# Patient Record
Sex: Female | Born: 1950 | Race: Black or African American | Hispanic: No | Smoking: Former smoker
Health system: Southern US, Community
[De-identification: ages and names within clinical notes are randomized; demographics above are authoritative.]

## PROBLEM LIST (undated history)

## (undated) DIAGNOSIS — K2270 Barrett's esophagus without dysplasia: Secondary | ICD-10-CM

## (undated) DIAGNOSIS — M1710 Unilateral primary osteoarthritis, unspecified knee: Secondary | ICD-10-CM

## (undated) DIAGNOSIS — M549 Dorsalgia, unspecified: Secondary | ICD-10-CM

## (undated) DIAGNOSIS — D259 Leiomyoma of uterus, unspecified: Secondary | ICD-10-CM

## (undated) DIAGNOSIS — K5792 Diverticulitis of intestine, part unspecified, without perforation or abscess without bleeding: Secondary | ICD-10-CM

## (undated) DIAGNOSIS — J449 Chronic obstructive pulmonary disease, unspecified: Secondary | ICD-10-CM

## (undated) DIAGNOSIS — I82409 Acute embolism and thrombosis of unspecified deep veins of unspecified lower extremity: Secondary | ICD-10-CM

## (undated) DIAGNOSIS — T7840XA Allergy, unspecified, initial encounter: Secondary | ICD-10-CM

## (undated) DIAGNOSIS — K449 Diaphragmatic hernia without obstruction or gangrene: Secondary | ICD-10-CM

## (undated) DIAGNOSIS — F32A Depression, unspecified: Secondary | ICD-10-CM

## (undated) DIAGNOSIS — Z87442 Personal history of urinary calculi: Secondary | ICD-10-CM

## (undated) DIAGNOSIS — O3410 Maternal care for benign tumor of corpus uteri, unspecified trimester: Secondary | ICD-10-CM

## (undated) DIAGNOSIS — M179 Osteoarthritis of knee, unspecified: Secondary | ICD-10-CM

## (undated) DIAGNOSIS — G5600 Carpal tunnel syndrome, unspecified upper limb: Secondary | ICD-10-CM

## (undated) DIAGNOSIS — F419 Anxiety disorder, unspecified: Secondary | ICD-10-CM

## (undated) HISTORY — DX: Diaphragmatic hernia without obstruction or gangrene: K44.9

## (undated) HISTORY — DX: Osteoarthritis of knee, unspecified: M17.9

## (undated) HISTORY — DX: Leiomyoma of uterus, unspecified: D25.9

## (undated) HISTORY — DX: Acute embolism and thrombosis of unspecified deep veins of unspecified lower extremity: I82.409

## (undated) HISTORY — DX: Unilateral primary osteoarthritis, unspecified knee: M17.10

## (undated) HISTORY — DX: Allergy, unspecified, initial encounter: T78.40XA

## (undated) HISTORY — DX: Barrett's esophagus without dysplasia: K22.70

## (undated) HISTORY — DX: Personal history of urinary calculi: Z87.442

## (undated) HISTORY — PX: TOTAL KNEE ARTHROPLASTY: SHX125

## (undated) HISTORY — DX: Carpal tunnel syndrome, unspecified upper limb: G56.00

## (undated) HISTORY — PX: CARPAL TUNNEL RELEASE: SHX101

## (undated) HISTORY — DX: Dorsalgia, unspecified: M54.9

## (undated) HISTORY — PX: COLON SURGERY: SHX602

## (undated) HISTORY — DX: Chronic obstructive pulmonary disease, unspecified: J44.9

## (undated) HISTORY — PX: LAPAROSCOPY: SHX197

## (undated) HISTORY — DX: Leiomyoma of uterus, unspecified: O34.10

## (undated) HISTORY — DX: Anxiety disorder, unspecified: F41.9

## (undated) HISTORY — DX: Depression, unspecified: F32.A

## (undated) HISTORY — DX: Diverticulitis of intestine, part unspecified, without perforation or abscess without bleeding: K57.92

---

## 1998-05-30 ENCOUNTER — Ambulatory Visit (HOSPITAL_COMMUNITY): Admission: RE | Admit: 1998-05-30 | Discharge: 1998-05-30 | Payer: Self-pay | Admitting: Obstetrics & Gynecology

## 1998-11-13 ENCOUNTER — Encounter: Admission: RE | Admit: 1998-11-13 | Discharge: 1999-02-11 | Payer: Self-pay | Admitting: Internal Medicine

## 1999-03-19 ENCOUNTER — Other Ambulatory Visit: Admission: RE | Admit: 1999-03-19 | Discharge: 1999-03-19 | Payer: Self-pay | Admitting: Obstetrics & Gynecology

## 1999-05-11 ENCOUNTER — Ambulatory Visit (HOSPITAL_COMMUNITY): Admission: RE | Admit: 1999-05-11 | Discharge: 1999-05-11 | Payer: Self-pay | Admitting: *Deleted

## 1999-05-11 ENCOUNTER — Encounter: Payer: Self-pay | Admitting: *Deleted

## 2000-06-06 ENCOUNTER — Ambulatory Visit (HOSPITAL_COMMUNITY): Admission: RE | Admit: 2000-06-06 | Discharge: 2000-06-06 | Payer: Self-pay | Admitting: Obstetrics & Gynecology

## 2000-06-06 ENCOUNTER — Encounter: Payer: Self-pay | Admitting: Obstetrics & Gynecology

## 2000-06-23 ENCOUNTER — Encounter: Admission: RE | Admit: 2000-06-23 | Discharge: 2000-09-21 | Payer: Self-pay | Admitting: Internal Medicine

## 2001-10-22 ENCOUNTER — Ambulatory Visit (HOSPITAL_COMMUNITY): Admission: RE | Admit: 2001-10-22 | Discharge: 2001-10-22 | Payer: Self-pay | Admitting: Obstetrics and Gynecology

## 2001-10-22 ENCOUNTER — Encounter: Payer: Self-pay | Admitting: Obstetrics and Gynecology

## 2002-10-14 ENCOUNTER — Other Ambulatory Visit: Admission: RE | Admit: 2002-10-14 | Discharge: 2002-10-14 | Payer: Self-pay | Admitting: Obstetrics and Gynecology

## 2003-11-15 ENCOUNTER — Other Ambulatory Visit: Admission: RE | Admit: 2003-11-15 | Discharge: 2003-11-15 | Payer: Self-pay | Admitting: Obstetrics and Gynecology

## 2003-11-18 ENCOUNTER — Encounter: Admission: RE | Admit: 2003-11-18 | Discharge: 2003-11-18 | Payer: Self-pay | Admitting: Obstetrics and Gynecology

## 2004-09-24 ENCOUNTER — Ambulatory Visit: Payer: Self-pay | Admitting: Internal Medicine

## 2004-10-01 ENCOUNTER — Ambulatory Visit: Payer: Self-pay | Admitting: Internal Medicine

## 2004-11-23 ENCOUNTER — Other Ambulatory Visit: Admission: RE | Admit: 2004-11-23 | Discharge: 2004-11-23 | Payer: Self-pay | Admitting: Internal Medicine

## 2004-11-23 ENCOUNTER — Ambulatory Visit: Payer: Self-pay | Admitting: Internal Medicine

## 2004-12-18 ENCOUNTER — Encounter: Admission: RE | Admit: 2004-12-18 | Discharge: 2004-12-18 | Payer: Self-pay | Admitting: Internal Medicine

## 2005-07-19 ENCOUNTER — Ambulatory Visit: Payer: Self-pay | Admitting: Internal Medicine

## 2005-07-23 ENCOUNTER — Encounter: Admission: RE | Admit: 2005-07-23 | Discharge: 2005-07-23 | Payer: Self-pay | Admitting: Internal Medicine

## 2005-08-27 ENCOUNTER — Ambulatory Visit: Payer: Self-pay | Admitting: Internal Medicine

## 2005-09-05 ENCOUNTER — Ambulatory Visit: Payer: Self-pay | Admitting: Gastroenterology

## 2005-09-06 ENCOUNTER — Encounter (INDEPENDENT_AMBULATORY_CARE_PROVIDER_SITE_OTHER): Payer: Self-pay | Admitting: Specialist

## 2005-09-06 ENCOUNTER — Ambulatory Visit: Payer: Self-pay | Admitting: Gastroenterology

## 2005-09-11 ENCOUNTER — Ambulatory Visit: Payer: Self-pay | Admitting: Gastroenterology

## 2005-10-01 ENCOUNTER — Ambulatory Visit: Payer: Self-pay | Admitting: Gastroenterology

## 2005-10-15 ENCOUNTER — Ambulatory Visit: Payer: Self-pay | Admitting: Internal Medicine

## 2005-10-18 ENCOUNTER — Encounter: Admission: RE | Admit: 2005-10-18 | Discharge: 2005-10-18 | Payer: Self-pay | Admitting: Internal Medicine

## 2006-02-16 ENCOUNTER — Encounter: Admission: RE | Admit: 2006-02-16 | Discharge: 2006-02-16 | Payer: Self-pay | Admitting: Internal Medicine

## 2006-05-02 ENCOUNTER — Encounter: Payer: Self-pay | Admitting: Internal Medicine

## 2006-09-18 ENCOUNTER — Encounter: Admission: RE | Admit: 2006-09-18 | Discharge: 2006-09-18 | Payer: Self-pay | Admitting: Internal Medicine

## 2007-02-23 ENCOUNTER — Ambulatory Visit: Payer: Self-pay | Admitting: Internal Medicine

## 2007-02-24 LAB — CONVERTED CEMR LAB
ALT: 21 units/L (ref 0–40)
Albumin: 4 g/dL (ref 3.5–5.2)
Alkaline Phosphatase: 80 units/L (ref 39–117)
BUN: 19 mg/dL (ref 6–23)
Basophils Absolute: 0 10*3/uL (ref 0.0–0.1)
Basophils Relative: 0.7 % (ref 0.0–1.0)
Calcium: 9.2 mg/dL (ref 8.4–10.5)
Eosinophils Absolute: 0.3 10*3/uL (ref 0.0–0.6)
GFR calc Af Amer: 83 mL/min
GFR calc non Af Amer: 69 mL/min
Lymphocytes Relative: 32.1 % (ref 12.0–46.0)
MCHC: 33.6 g/dL (ref 30.0–36.0)
MCV: 83 fL (ref 78.0–100.0)
Monocytes Relative: 6.4 % (ref 3.0–11.0)
Neutro Abs: 3.7 10*3/uL (ref 1.4–7.7)
Platelets: 236 10*3/uL (ref 150–400)

## 2007-03-02 ENCOUNTER — Encounter: Payer: Self-pay | Admitting: Internal Medicine

## 2008-01-28 ENCOUNTER — Ambulatory Visit: Payer: Self-pay | Admitting: Internal Medicine

## 2008-01-28 DIAGNOSIS — M5416 Radiculopathy, lumbar region: Secondary | ICD-10-CM

## 2008-01-28 DIAGNOSIS — E1149 Type 2 diabetes mellitus with other diabetic neurological complication: Secondary | ICD-10-CM | POA: Insufficient documentation

## 2008-01-28 DIAGNOSIS — M48061 Spinal stenosis, lumbar region without neurogenic claudication: Secondary | ICD-10-CM | POA: Insufficient documentation

## 2008-01-28 DIAGNOSIS — R609 Edema, unspecified: Secondary | ICD-10-CM | POA: Insufficient documentation

## 2008-01-28 LAB — CONVERTED CEMR LAB
ALT: 12 units/L (ref 0–35)
Amylase: 146 units/L — ABNORMAL HIGH (ref 27–131)
Basophils Absolute: 0 10*3/uL (ref 0.0–0.1)
Bilirubin, Direct: 0.1 mg/dL (ref 0.0–0.3)
CO2: 30 meq/L (ref 19–32)
Calcium: 9.8 mg/dL (ref 8.4–10.5)
Cholesterol: 196 mg/dL (ref 0–200)
Creatinine, Ser: 1 mg/dL (ref 0.4–1.2)
Creatinine,U: 101.1 mg/dL
Eosinophils Absolute: 0.3 10*3/uL (ref 0.0–0.6)
GFR calc Af Amer: 73 mL/min
GFR calc non Af Amer: 61 mL/min
Glucose, Urine, Semiquant: NEGATIVE
Hemoglobin: 12.8 g/dL (ref 12.0–15.0)
Hgb A1c MFr Bld: 6.1 % — ABNORMAL HIGH (ref 4.6–6.0)
Ketones, urine, test strip: NEGATIVE
LDL Cholesterol: 131 mg/dL — ABNORMAL HIGH (ref 0–99)
Lipase: 36 units/L (ref 11.0–59.0)
Lymphocytes Relative: 25 % (ref 12.0–46.0)
MCHC: 31.7 g/dL (ref 30.0–36.0)
MCV: 84.8 fL (ref 78.0–100.0)
Microalb Creat Ratio: 2 mg/g (ref 0.0–30.0)
Microalb, Ur: 0.2 mg/dL (ref 0.0–1.9)
Neutro Abs: 2.5 10*3/uL (ref 1.4–7.7)
Neutrophils Relative %: 57.3 % (ref 43.0–77.0)
Protein, U semiquant: NEGATIVE
RDW: 14.9 % — ABNORMAL HIGH (ref 11.5–14.6)
Sodium: 146 meq/L — ABNORMAL HIGH (ref 135–145)
Total Bilirubin: 0.8 mg/dL (ref 0.3–1.2)
Triglycerides: 37 mg/dL (ref 0–149)
Urobilinogen, UA: 0.2
VLDL: 7 mg/dL (ref 0–40)
pH: 5.5

## 2008-02-01 DIAGNOSIS — K219 Gastro-esophageal reflux disease without esophagitis: Secondary | ICD-10-CM | POA: Insufficient documentation

## 2008-02-01 DIAGNOSIS — Z87442 Personal history of urinary calculi: Secondary | ICD-10-CM | POA: Insufficient documentation

## 2008-02-05 ENCOUNTER — Ambulatory Visit: Payer: Self-pay | Admitting: Internal Medicine

## 2008-02-15 ENCOUNTER — Encounter: Admission: RE | Admit: 2008-02-15 | Discharge: 2008-02-15 | Payer: Self-pay | Admitting: Internal Medicine

## 2008-03-29 ENCOUNTER — Ambulatory Visit: Payer: Self-pay | Admitting: Internal Medicine

## 2008-10-03 ENCOUNTER — Encounter (INDEPENDENT_AMBULATORY_CARE_PROVIDER_SITE_OTHER): Payer: Self-pay | Admitting: *Deleted

## 2008-10-04 ENCOUNTER — Encounter: Payer: Self-pay | Admitting: Internal Medicine

## 2008-11-04 LAB — HM DEXA SCAN: HM DEXA SCAN: NORMAL

## 2009-02-02 ENCOUNTER — Telehealth: Payer: Self-pay | Admitting: Internal Medicine

## 2009-03-31 ENCOUNTER — Ambulatory Visit: Payer: Self-pay | Admitting: Internal Medicine

## 2009-03-31 LAB — CONVERTED CEMR LAB
Bilirubin Urine: NEGATIVE
Blood in Urine, dipstick: NEGATIVE
Glucose, Urine, Semiquant: NEGATIVE
Ketones, urine, test strip: NEGATIVE
Protein, U semiquant: NEGATIVE
Urobilinogen, UA: 0.2
pH: 6

## 2009-04-04 LAB — CONVERTED CEMR LAB
AST: 25 units/L (ref 0–37)
Albumin: 4 g/dL (ref 3.5–5.2)
Alkaline Phosphatase: 74 units/L (ref 39–117)
BUN: 25 mg/dL — ABNORMAL HIGH (ref 6–23)
Bilirubin, Direct: 0.1 mg/dL (ref 0.0–0.3)
CO2: 30 meq/L (ref 19–32)
Chloride: 109 meq/L (ref 96–112)
Cholesterol: 202 mg/dL — ABNORMAL HIGH (ref 0–200)
Creatinine, Ser: 1 mg/dL (ref 0.4–1.2)
Direct LDL: 138 mg/dL
Eosinophils Absolute: 0.2 10*3/uL (ref 0.0–0.7)
Glucose, Bld: 109 mg/dL — ABNORMAL HIGH (ref 70–99)
Hgb A1c MFr Bld: 6.3 % (ref 4.6–6.5)
Lipase: 28 units/L (ref 11.0–59.0)
Lymphocytes Relative: 42.3 % (ref 12.0–46.0)
MCHC: 33.5 g/dL (ref 30.0–36.0)
MCV: 84.6 fL (ref 78.0–100.0)
Microalb Creat Ratio: 1.9 mg/g (ref 0.0–30.0)
Microalb, Ur: 0.2 mg/dL (ref 0.0–1.9)
Monocytes Absolute: 0.5 10*3/uL (ref 0.1–1.0)
Neutrophils Relative %: 40.8 % — ABNORMAL LOW (ref 43.0–77.0)
Platelets: 181 10*3/uL (ref 150.0–400.0)
TSH: 0.59 microintl units/mL (ref 0.35–5.50)
Total Protein: 7.1 g/dL (ref 6.0–8.3)
VLDL: 6.6 mg/dL (ref 0.0–40.0)
WBC: 4.2 10*3/uL — ABNORMAL LOW (ref 4.5–10.5)

## 2009-04-28 ENCOUNTER — Encounter: Payer: Self-pay | Admitting: Internal Medicine

## 2009-06-19 ENCOUNTER — Encounter: Payer: Self-pay | Admitting: Internal Medicine

## 2009-06-19 ENCOUNTER — Telehealth: Payer: Self-pay | Admitting: Internal Medicine

## 2009-07-20 ENCOUNTER — Encounter: Admission: RE | Admit: 2009-07-20 | Discharge: 2009-07-20 | Payer: Self-pay | Admitting: Internal Medicine

## 2009-07-21 DIAGNOSIS — R928 Other abnormal and inconclusive findings on diagnostic imaging of breast: Secondary | ICD-10-CM | POA: Insufficient documentation

## 2009-07-25 ENCOUNTER — Encounter: Payer: Self-pay | Admitting: Internal Medicine

## 2009-07-27 ENCOUNTER — Encounter: Admission: RE | Admit: 2009-07-27 | Discharge: 2009-07-27 | Payer: Self-pay | Admitting: Internal Medicine

## 2009-07-28 ENCOUNTER — Encounter: Payer: Self-pay | Admitting: Internal Medicine

## 2009-09-15 ENCOUNTER — Encounter: Payer: Self-pay | Admitting: Internal Medicine

## 2009-10-06 ENCOUNTER — Ambulatory Visit: Payer: Self-pay | Admitting: Internal Medicine

## 2009-10-10 ENCOUNTER — Encounter: Payer: Self-pay | Admitting: Internal Medicine

## 2009-10-10 LAB — CONVERTED CEMR LAB
ALT: 15 units/L (ref 0–35)
AST: 20 units/L (ref 0–37)
BUN: 13 mg/dL (ref 6–23)
Basophils Absolute: 0 10*3/uL (ref 0.0–0.1)
Chloride: 103 meq/L (ref 96–112)
Creatinine,U: 108.2 mg/dL
Eosinophils Relative: 7.4 % — ABNORMAL HIGH (ref 0.0–5.0)
GFR calc non Af Amer: 82.4 mL/min (ref >60)
HDL: 62.9 mg/dL (ref >39.00)
Hemoglobin: 12.9 g/dL (ref 12.0–15.0)
Lymphocytes Relative: 40.5 % (ref 12.0–46.0)
Microalb Creat Ratio: 0.9 mg/g (ref 0.0–30.0)
Monocytes Relative: 9.8 % (ref 3.0–12.0)
Neutro Abs: 2 10*3/uL (ref 1.4–7.7)
Phosphorus: 3.7 mg/dL (ref 2.3–4.6)
Potassium: 4 meq/L (ref 3.5–5.1)
RBC: 4.64 M/uL (ref 3.87–5.11)
RDW: 14 % (ref 11.5–14.6)
Sodium: 142 meq/L (ref 135–145)
Total CHOL/HDL Ratio: 3
Triglycerides: 40 mg/dL (ref 0.0–149.0)
WBC: 4.8 10*3/uL (ref 4.5–10.5)

## 2009-10-11 ENCOUNTER — Ambulatory Visit: Payer: Self-pay

## 2009-10-11 ENCOUNTER — Encounter: Payer: Self-pay | Admitting: Internal Medicine

## 2009-10-12 ENCOUNTER — Ambulatory Visit: Payer: Self-pay | Admitting: Cardiovascular Disease

## 2009-10-26 ENCOUNTER — Telehealth: Payer: Self-pay | Admitting: Internal Medicine

## 2009-10-31 ENCOUNTER — Encounter: Payer: Self-pay | Admitting: Internal Medicine

## 2009-11-14 ENCOUNTER — Encounter: Payer: Self-pay | Admitting: Internal Medicine

## 2010-01-25 ENCOUNTER — Encounter: Admission: RE | Admit: 2010-01-25 | Discharge: 2010-01-25 | Payer: Self-pay | Admitting: Internal Medicine

## 2010-01-25 LAB — HM MAMMOGRAPHY: HM Mammogram: NORMAL

## 2010-03-17 IMAGING — CT CT ABDOMEN W/ CM
2 of 6 series · 17 of 46 positions shown, 19 images · IV contrast (agent unspecified)
Comparison: 10/18/2005

CLINICAL DATA: Abdominal pain, elevated amylase

CT ABDOMEN WITH CONTRAST
TECHNIQUE: Multidetector CT imaging of the abdomen was performed
using the standard protocol following bolus administration of
intravenous contrast.
Contrast: 100 ml Ymnipaque-Y11 IV

[Series 2: abd_ xxl 5.0 · axial · 0.84mm/px · z∈[-285,-35]mm · 14 of 58 slices shown, 16 images]
[im 4/58  soft-tissue]
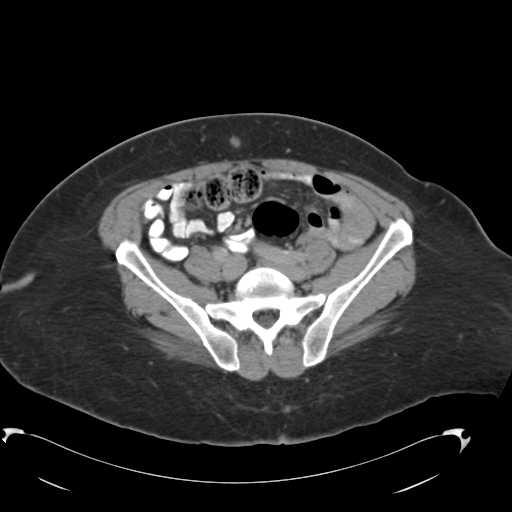
[im 4/58  bone]
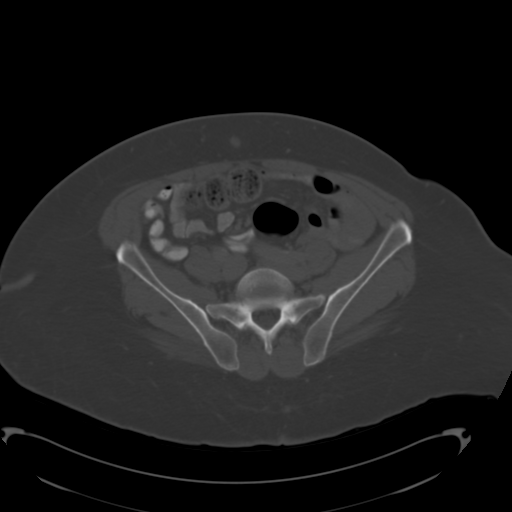
[im 8/58  soft-tissue]
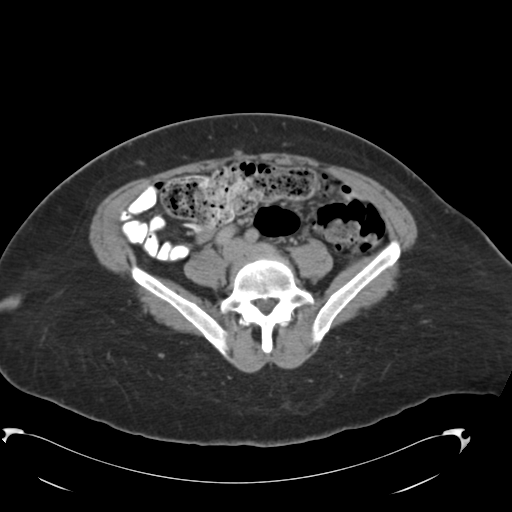
[im 12/58  soft-tissue]
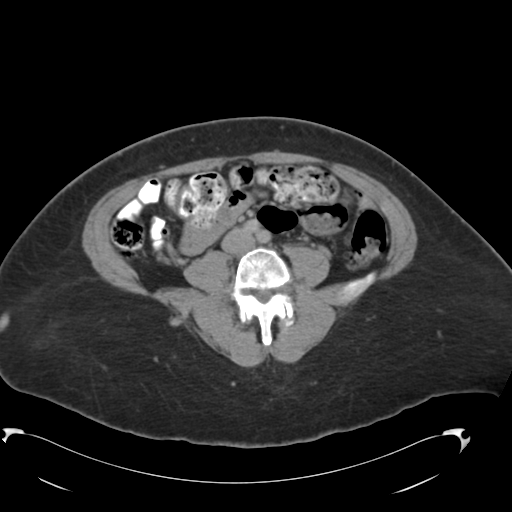
[im 16/58  soft-tissue]
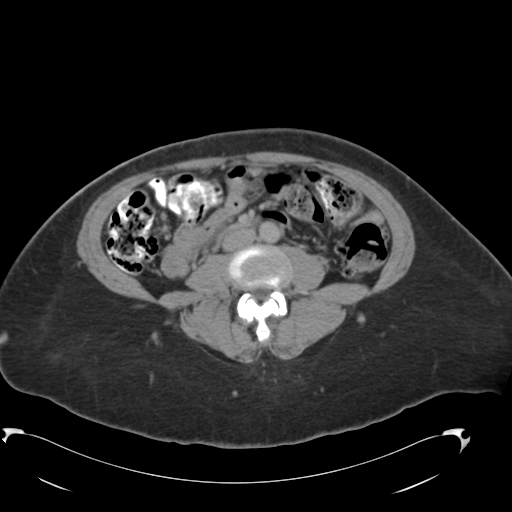
[im 20/58  soft-tissue]
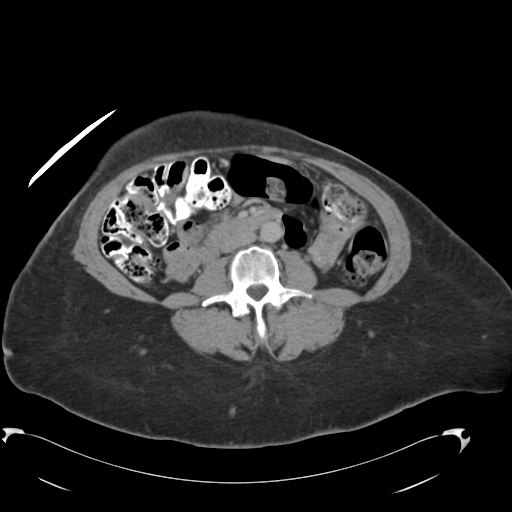
[im 23/58  soft-tissue]
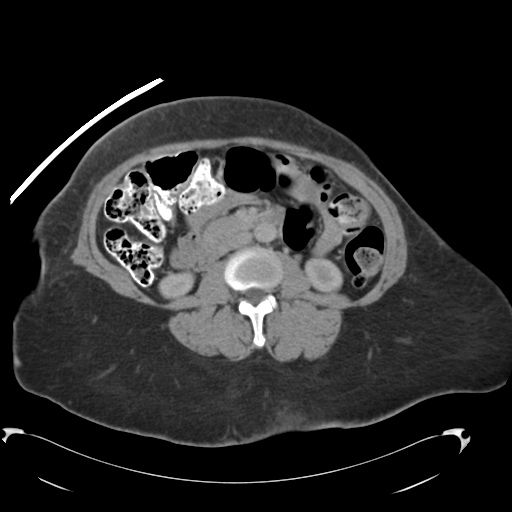
[im 27/58  soft-tissue]
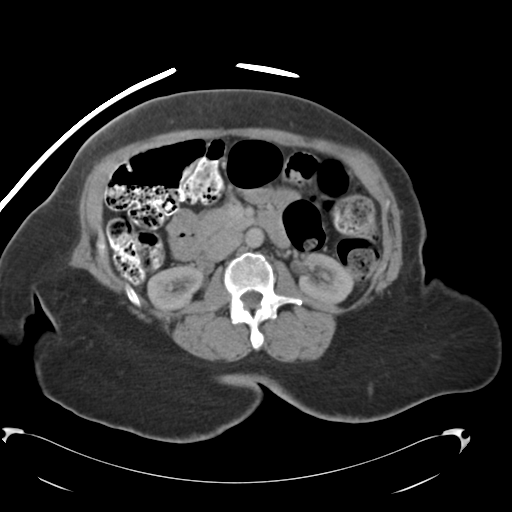
[im 31/58  soft-tissue]
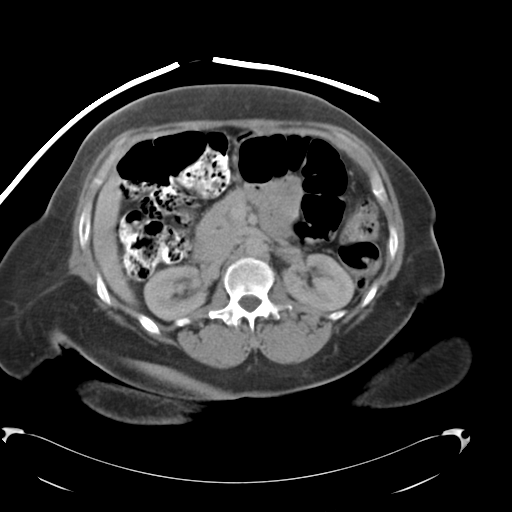
[im 35/58  soft-tissue]
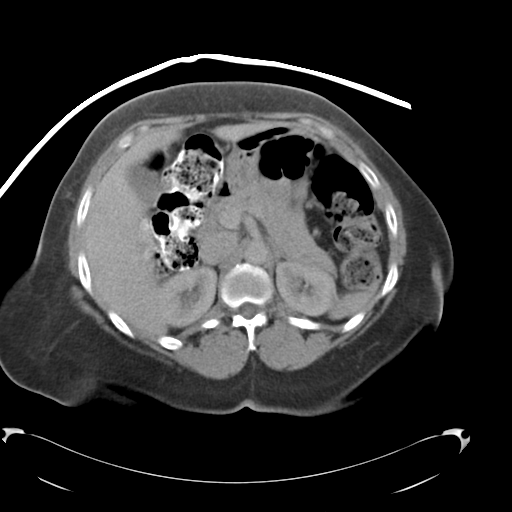
[im 35/58  bone]
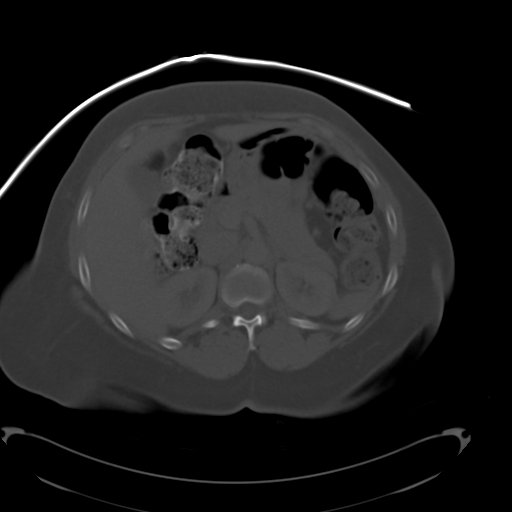
[im 39/58  soft-tissue]
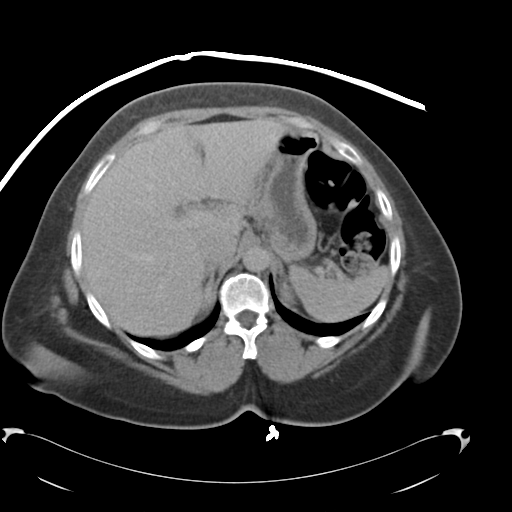
[im 42/58  soft-tissue]
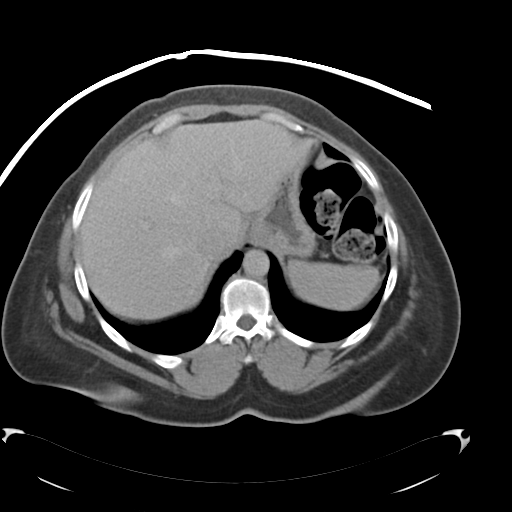
[im 46/58  soft-tissue]
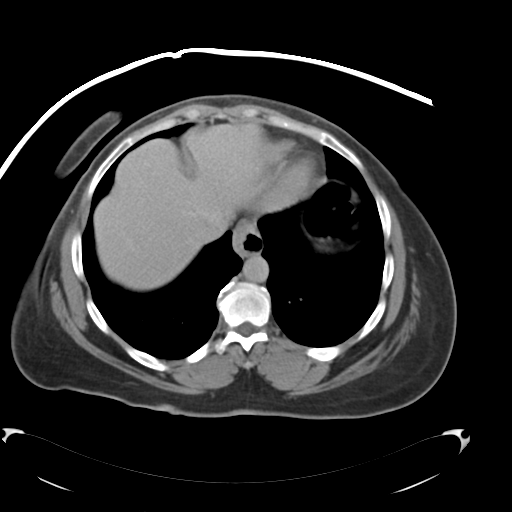
[im 50/58  soft-tissue]
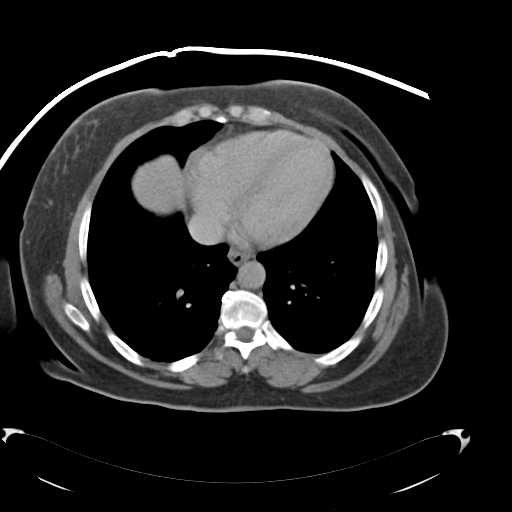
[im 54/58  soft-tissue]
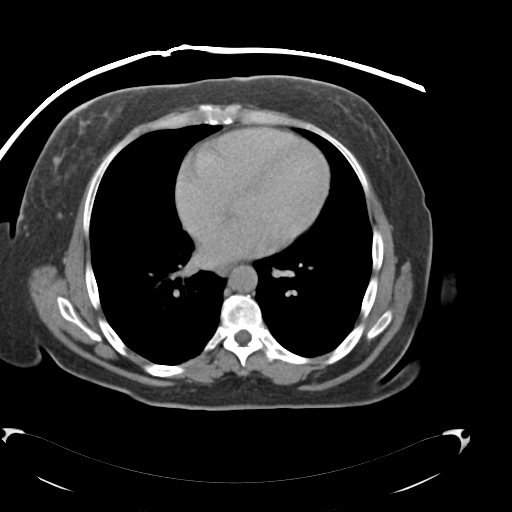

[Series 603: <mpr range> · coronal · 0.84mm/px · 3 of 132 slices shown]
[im 44/132  soft-tissue]
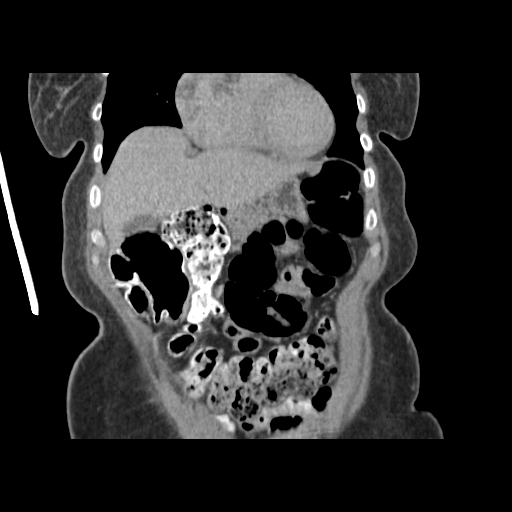
[im 59/132  soft-tissue]
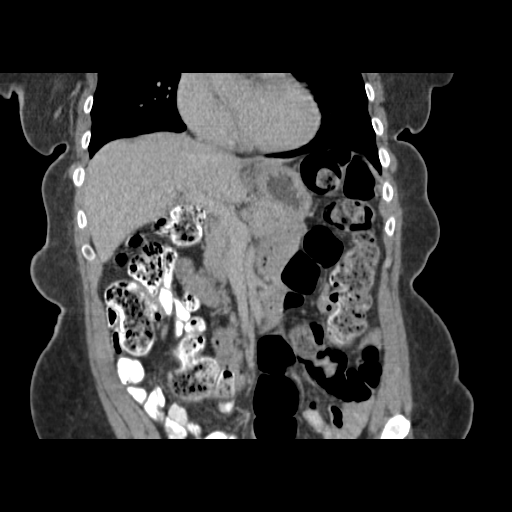
[im 73/132  soft-tissue]
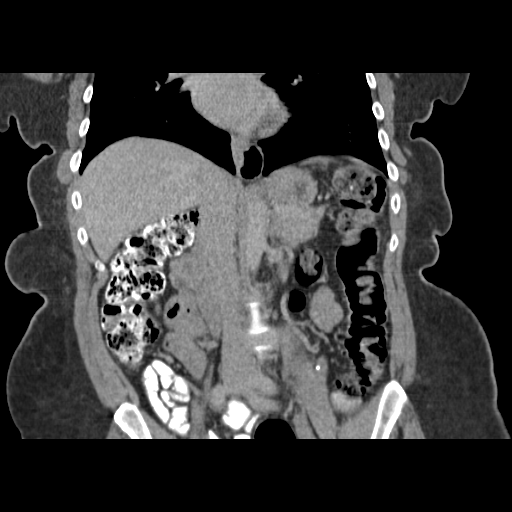

[17 of 46 positions shown; findings below may reference images not displayed]

FINDINGS: Unremarkable liver, nondilated gallbladder, spleen,
adrenal glands, kidneys, pancreas, abdominal aorta.  Visualized
portions of small bowel and colon are decompressed.  No free air.
No ascites.  Portal vein patent.  No mesenteric or retroperitoneal
adenopathy.  Delayed scans show no hydronephrosis.  Facet disease
L4-5 likely accounts for the grade 1 anterolisthesis at this level,
which is increased since previous MR 02/16/2006.
IMPRESSION: 1.  No acute abdominal process.
2.  Progressive facet degenerative changes L4-5 with grade 1
anterolisthesis.

## 2010-07-12 ENCOUNTER — Ambulatory Visit: Payer: Self-pay | Admitting: Internal Medicine

## 2010-07-12 ENCOUNTER — Encounter (INDEPENDENT_AMBULATORY_CARE_PROVIDER_SITE_OTHER): Payer: Self-pay | Admitting: *Deleted

## 2010-07-16 LAB — CONVERTED CEMR LAB
Albumin: 4.2 g/dL (ref 3.5–5.2)
Alkaline Phosphatase: 83 units/L (ref 39–117)
Amylase: 193 units/L — ABNORMAL HIGH (ref 27–131)
BUN: 19 mg/dL (ref 6–23)
Basophils Absolute: 0.1 10*3/uL (ref 0.0–0.1)
Bilirubin, Direct: 0.1 mg/dL (ref 0.0–0.3)
Eosinophils Absolute: 0.1 10*3/uL (ref 0.0–0.7)
Glucose, Bld: 97 mg/dL (ref 70–99)
Lipase: 33 units/L (ref 11.0–59.0)
Lymphocytes Relative: 40.8 % (ref 12.0–46.0)
MCHC: 33.2 g/dL (ref 30.0–36.0)
Neutrophils Relative %: 45.7 % (ref 43.0–77.0)
Phosphorus: 3.5 mg/dL (ref 2.3–4.6)
Platelets: 177 10*3/uL (ref 150.0–400.0)
Potassium: 4.5 meq/L (ref 3.5–5.1)
RDW: 14.8 % — ABNORMAL HIGH (ref 11.5–14.6)

## 2010-11-24 ENCOUNTER — Encounter: Payer: Self-pay | Admitting: Internal Medicine

## 2010-12-04 NOTE — Letter (Signed)
Summary: [facility]  [facility]   Imported By: Lanelle Bal 11/20/2009 08:10:13  _____________________________________________________________________  External Attachment:    Type:   Image     Comment:   External Document

## 2010-12-04 NOTE — Letter (Signed)
Summary: New Patient letter  [facility]  [address] [place], [place] [address]   Phone: [phone]  Fax: [phone]       07/12/2010 MRN: [id number]  Heather Shaffer [address] [place], [place]  [address]  Dear Ms. Harbuck,  Welcome to the Gastroenterology Division at [facility].    You are scheduled to see Dr.  Melvia Heaps on August 23, 2010 at 10:45am on the 3rd floor at [facility], [address]  We ask that you try to arrive at our office 15 minutes prior to your appointment time to allow for check-in.  We would like you to complete the enclosed self-administered evaluation form prior to your visit and bring it with you on the day of your appointment.  We will review it with you.  Also, please bring a complete list of all your medications or, if you prefer, bring the medication bottles and we will list them.  Please bring your insurance card so that we may make a copy of it.  If your insurance requires a referral to see a specialist, please bring your referral form from your primary care physician.  Co-payments are due at the time of your visit and may be paid by cash, check or credit card.     Your office visit will consist of a consult with your physician (includes a physical exam), any laboratory testing he/she may order, scheduling of any necessary diagnostic testing (e.g. x-ray, ultrasound, CT-scan), and scheduling of a procedure (e.g. Endoscopy, Colonoscopy) if required.  Please allow enough time on your schedule to allow for any/all of these possibilities.    If you cannot keep your appointment, please call [phone] to cancel or reschedule prior to your appointment date.  This allows [place] the opportunity to schedule an appointment for another patient in need of care.  If you do not cancel or reschedule by 5 p.m. the business day prior to your appointment date, you will be charged a $50.00 late cancellation/no-show fee.     Thank you for choosing [facility] for your medical needs.  We appreciate the opportunity to care for you.  Please visit [place] at our website  to learn more about our practice.                     Sincerely,                                                             The Gastroenterology Division

## 2010-12-04 NOTE — Miscellaneous (Signed)
   Clinical Lists Changes  Observations: Added new observation of DIAB EYE EX: No diabetic retinopathy.    (11/10/2009 11:04)      Diabetic Eye Exam  Procedure date:  11/10/2009  Findings:      No diabetic retinopathy.

## 2010-12-04 NOTE — Assessment & Plan Note (Signed)
Summary: 6 MTH FU/CLE   Vital Signs:  Patient profile:   60 year old female Weight:      228 pounds BMI:     39.90 Temp:     98.2 degrees F oral Pulse rate:   64 / minute Pulse rhythm:   regular BP sitting:   122 / 78  (left arm) Cuff size:   large  Vitals Entered By: Mervin Hack CMA Duncan Dull) (July 12, 2010 2:39 PM) CC: 6 month follow-up   History of Present Illness: Doing okay generally  Still has sense of something sitting right over xiphoid No problems swallowing but gets sense down low occ chokes in throat--feels like something lodged there  Has had a lot of inactivity been sitting in hospital and home with sister dying of pancreatic cancer having a hard time with this  Checking sugars several times per week at various times Fasting AM are often over 130 though not over 150 Non fasting are generally in the 150's  Still having pain in back and neck generally gets by with one hydrocodone daily  No chest pan No SOB   Allergies: 1)  ! Bactrim Ds (Sulfamethoxazole-Trimethoprim)  Past History:  Past medical, surgical, family and social histories (including risk factors) reviewed for relevance to current acute and chronic problems.  Past Medical History: Back injections at [facility] for back pain &  spinal stenosis Diabetes mellitus, type II DVT, hx of 1998 post knee replacement Barretts esophagus 09/2005 Dr.  Arlyce Dice Carpal tunnel syndrome Nephrolithiasis, hx of Hyperlipidemia Uterine fibroids Diverticulosis 2006 Hemorrhoids,internal 2006  Past Surgical History: Reviewed history from 03/29/2008 and no changes required. Right knee replacement Laparoscopy childbirth  Family History: Reviewed history from 10/06/2009 and no changes required. Family History Diabetes 1st degree relative Family History Hypertension CAD Sister with pancreas cancer  Social History: Reviewed history from 03/29/2008 and no changes required. 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  NAD Neck:  supple, no masses, no thyromegaly, no carotid bruits, and no cervical lymphadenopathy.  Mild pain with full rotation both ways Lungs:  normal respiratory effort, no intercostal retractions, no accessory muscle use, and normal breath sounds.   Heart:  normal rate, regular rhythm, no murmur, and no gallop.   Abdomen:  soft and non-tender.   Pulses:  1+ in each foot Extremities:  trace edema Skin:  no suspicious lesions and no ulcerations.   Psych:  normally interactive, good eye contact, not anxious appearing, and dysphoric affect.    Diabetes Management Exam:    Foot Exam (with socks and/or shoes not present):       Sensory-Pinprick/Light touch:          Left medial foot (L-4): diminished          Left dorsal foot (L-5): diminished          Left lateral foot (S-1): diminished          Right medial foot (L-4): diminished          Right dorsal foot (L-5): diminished          Right lateral foot (S-1): diminished       Inspection:  Left foot: normal          Right foot: abnormal             Comments: pes planus       Nails:          Left foot: thickened          Right foot: thickened   Impression & Recommendations:  Problem # 1:  DIABETES MELLITUS, TYPE II (ICD-250.00) Assessment Deteriorated  control worsened will start metformin if >8% she will work on lifestyle issues  Orders: TLB-A1C / Hgb A1C (Glycohemoglobin) (83036-A1C)  Problem # 2:  ABDOMINAL PAIN (ICD-789.00) Assessment: Unchanged  persistent seemingly esophageal discomfort will have her check back in with Dr Arlyce Dice  Orders: TLB-Amylase  (82150-AMYL) TLB-Lipase (83690-LIPASE) TLB-Renal Function Panel (80069-RENAL) TLB-CBC Platelet - w/Differential (85025-CBCD) TLB-Hepatic/Liver Function Pnl (80076-HEPATIC) TLB-TSH (Thyroid Stimulating Hormone) (84443-TSH) Venipuncture (09811) Gastroenterology Referral (GI)  Problem # 3:  DEGENERATIVE JOINT DISEASE, 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 1/2- 1 three  times a day as needed severe pain  Patient Instructions: 1)  Please schedule a follow-up appointment in 6 months .  2)  Referral Appointment Information 3)  Day/Date: 4)  Time: 5)  Place/MD: 6)  Address: 7)  Phone/Fax: 8)  Patient given appointment information. Information/Orders faxed/mailed. Prescriptions: VICODIN 5-500 MG  TABS (HYDROCODONE-ACETAMINOPHEN) 1/2- 1 three  times a day as needed severe pain  #90 x 1   Entered and Authorized by:   Cindee Salt MD   Signed by:   Cindee Salt MD on 07/12/2010   Method used:   Print then Give to Patient   RxID:   [id number]   Current Allergies (reviewed today): ! BACTRIM DS (SULFAMETHOXAZOLE-TRIMETHOPRIM)

## 2010-12-14 ENCOUNTER — Encounter: Payer: Self-pay | Admitting: Internal Medicine

## 2010-12-14 ENCOUNTER — Other Ambulatory Visit: Payer: Self-pay | Admitting: Internal Medicine

## 2010-12-14 ENCOUNTER — Ambulatory Visit (INDEPENDENT_AMBULATORY_CARE_PROVIDER_SITE_OTHER): Payer: 59 | Admitting: Internal Medicine

## 2010-12-14 DIAGNOSIS — E119 Type 2 diabetes mellitus without complications: Secondary | ICD-10-CM

## 2010-12-14 DIAGNOSIS — M479 Spondylosis, unspecified: Secondary | ICD-10-CM

## 2010-12-14 DIAGNOSIS — E785 Hyperlipidemia, unspecified: Secondary | ICD-10-CM

## 2010-12-20 NOTE — Assessment & Plan Note (Signed)
Summary: 6 m f/u dlo   Vital Signs:  Patient profile:   60 year old female Weight:      239 pounds Temp:     98.6 degrees F oral Pulse rate:   60 / minute Pulse rhythm:   regular BP sitting:   113 / 76  (left arm) Cuff size:   large  Vitals Entered By: Mervin Hack CMA Duncan Dull) (December 14, 2010 11:02 AM) CC: follow-up   History of Present Illness: Doing okay  checks sugars occ generally only a little over 100 Due for eye exam  No sores on feet Ongoing back pain and down right side uses hydrocodone  ~4 times per week No other meds  Trying to get back to eating right prefers no meds for this  No swallowing problems Occ feelings of fullness No heartburn---occ gets burp  Allergies: 1)  ! Bactrim Ds (Sulfamethoxazole-Trimethoprim)  Past History:  Past medical, surgical, family and social histories (including risk factors) reviewed for relevance to current acute and chronic problems.  Past Medical History: Reviewed history from 07/12/2010 and no changes required. Back injections at [facility] for back pain &  spinal stenosis Diabetes mellitus, type II DVT, hx of 1998 post knee replacement Barretts esophagus 09/2005 Dr.  Arlyce Dice Carpal tunnel syndrome Nephrolithiasis, hx of Hyperlipidemia Uterine fibroids Diverticulosis 2006 Hemorrhoids,internal 2006  Past Surgical History: Reviewed history from 03/29/2008 and no changes required. Right knee replacement Laparoscopy childbirth  Family History: Family History Diabetes 1st degree relative Family History Hypertension CAD Sister died of  pancreas cancer  Social History: Reviewed history from 03/29/2008 and no changes required. 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 Uses wood stove--discussed humidifier  Physical Exam  General:  alert and normal appearance.   Neck:  supple, no masses, and no cervical lymphadenopathy.   Lungs:  normal respiratory effort, no intercostal retractions, no accessory muscle use, and normal breath sounds.   Heart:  normal rate, regular rhythm, no murmur, and no gallop.   Abdomen:  soft and non-tender.   Msk:  Very limited movement up to table or reclining due to back Pulses:  1+ in feet Extremities:  no edema Skin:  no suspicious lesions and no ulcerations.   Psych:  normally interactive, good eye contact, not anxious appearing, and not depressed appearing.    Diabetes Management Exam:    Foot Exam (with socks and/or shoes not present):       Sensory-Pinprick/Light touch:          Left medial foot (L-4): diminished          Left dorsal foot (L-5): diminished          Left lateral foot (S-1): diminished          Right medial foot (L-4): diminished          Right dorsal foot (L-5): diminished          Right lateral foot (S-1): diminished       Inspection:          Left foot: normal          Right foot: normal       Nails:  Left foot: normal          Right foot: normal   Impression & Recommendations:  Problem # 1:  DIABETES MELLITUS, TYPE II (ICD-250.00) Assessment Unchanged  seems to have good control without meds will work more on lifestyle since her sister's death and loss of responsibility and time with her  Labs Reviewed: Creat: 0.9 (07/12/2010)     Last Eye Exam: No diabetic retinopathy.    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 1/2- 1 three  times a day as needed severe pain  Patient Instructions: 1)  Please schedule a follow-up appointment in 6 months .    Orders Added: 1)  Est. Patient Level IV [[address]] 2)  TLB-A1C / Hgb A1C (Glycohemoglobin) [83036-A1C]    Current Allergies (reviewed today): ! BACTRIM DS (SULFAMETHOXAZOLE-TRIMETHOPRIM)

## 2011-06-14 ENCOUNTER — Encounter: Payer: Self-pay | Admitting: Internal Medicine

## 2011-06-17 ENCOUNTER — Encounter: Payer: Self-pay | Admitting: Internal Medicine

## 2011-06-17 ENCOUNTER — Ambulatory Visit (INDEPENDENT_AMBULATORY_CARE_PROVIDER_SITE_OTHER): Payer: 59 | Admitting: Internal Medicine

## 2011-06-17 VITALS — BP 130/78 | HR 66 | Temp 98.6°F | Ht 63.0 in | Wt 241.0 lb

## 2011-06-17 DIAGNOSIS — F439 Reaction to severe stress, unspecified: Secondary | ICD-10-CM

## 2011-06-17 DIAGNOSIS — E119 Type 2 diabetes mellitus without complications: Secondary | ICD-10-CM

## 2011-06-17 DIAGNOSIS — Z2911 Encounter for prophylactic immunotherapy for respiratory syncytial virus (RSV): Secondary | ICD-10-CM

## 2011-06-17 DIAGNOSIS — E785 Hyperlipidemia, unspecified: Secondary | ICD-10-CM

## 2011-06-17 DIAGNOSIS — Z0000 Encounter for general adult medical examination without abnormal findings: Secondary | ICD-10-CM

## 2011-06-17 DIAGNOSIS — Z0001 Encounter for general adult medical examination with abnormal findings: Secondary | ICD-10-CM | POA: Insufficient documentation

## 2011-06-17 DIAGNOSIS — M479 Spondylosis, unspecified: Secondary | ICD-10-CM

## 2011-06-17 DIAGNOSIS — Z733 Stress, not elsewhere classified: Secondary | ICD-10-CM

## 2011-06-17 LAB — BASIC METABOLIC PANEL
BUN: 18 mg/dL (ref 6–23)
Calcium: 9.3 mg/dL (ref 8.4–10.5)
Chloride: 99 mEq/L (ref 96–112)
Creatinine, Ser: 1.1 mg/dL (ref 0.4–1.2)

## 2011-06-17 LAB — HEPATIC FUNCTION PANEL
Albumin: 4.3 g/dL (ref 3.5–5.2)
Alkaline Phosphatase: 95 U/L (ref 39–117)
Total Protein: 8.2 g/dL (ref 6.0–8.3)

## 2011-06-17 LAB — CBC WITH DIFFERENTIAL/PLATELET
Eosinophils Absolute: 0.5 10*3/uL (ref 0.0–0.7)
Eosinophils Relative: 8.5 % — ABNORMAL HIGH (ref 0.0–5.0)
MCHC: 32.6 g/dL (ref 30.0–36.0)
MCV: 85.1 fl (ref 78.0–100.0)
Monocytes Absolute: 0.4 10*3/uL (ref 0.1–1.0)
Neutrophils Relative %: 53 % (ref 43.0–77.0)
Platelets: 209 10*3/uL (ref 150.0–400.0)
WBC: 5.4 10*3/uL (ref 4.5–10.5)

## 2011-06-17 LAB — LIPID PANEL
HDL: 76.1 mg/dL (ref >39.00)
Total CHOL/HDL Ratio: 3
VLDL: 6.4 mg/dL (ref 0.0–40.0)

## 2011-06-17 LAB — HEMOGLOBIN A1C: Hgb A1c MFr Bld: 6.7 % — ABNORMAL HIGH (ref 4.6–6.5)

## 2011-06-17 LAB — LDL CHOLESTEROL, DIRECT: Direct LDL: 126.9 mg/dL

## 2011-06-17 NOTE — Assessment & Plan Note (Signed)
Lab Results  Component Value Date   LDLCALC 126* 10/06/2009   Will recheck  Discussed meds She adamantly doesn't want meds for this
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
## 2011-06-17 NOTE — Assessment & Plan Note (Signed)
Still seems to have good control Will recheck A1c 

## 2011-06-17 NOTE — Assessment & Plan Note (Signed)
Ongoing pain but currently is seems her pain might be more in her knee Has ortho appt coming up

## 2011-06-17 NOTE — Progress Notes (Signed)
Subjective:    Patient ID: Heather Shaffer, female    DOB: 1951/01/03, 60 y.o.   MRN: [id number]  HPI Having "excrutiating pain" in right leg Hard to walk Did have arthroscopy on right knee in 1998--then attributable to spinal stenosis Can't lie down flat with knee---has to keep bent Does have appt set up with orthopedic surgeon at [facility] Has gotten 1 blister---worried about shingles  Sees gyn at [facility] Last pap 1-2 years ago Gets breast exam and mammogram at [facility] this year  Checks sugars once a week or so 100-120 Due for eye exam---she will schedule  No current outpatient prescriptions on file prior to visit.    Allergies  Allergen Reactions  . Sulfamethoxazole W/Trimethoprim     REACTION: tongue swells    Past Medical History  Diagnosis Date  . Diabetes mellitus   . DVT (deep venous thrombosis)     hx of 1998 post knee replacement  . Back pain     Back injections at [facility] for back pain &  spinal stenosis  . Barrett's esophagus   . Carpal tunnel syndrome   . History of nephrolithiasis   . Hyperlipidemia   . Uterine fibroids affecting pregnancy   . Diverticulitis   . Hemorrhoids     Past Surgical History  Procedure Date  . Total knee arthroplasty     right  . Laparoscopy     Family History  Problem Relation Age of Onset  . Cancer Sister     History   Social History  . Marital Status: Married    Spouse Name: N/A    Number of Children: 3  . Years of Education: N/A   Occupational History  . Realtor, Emergency planning/management officer    Social History Main Topics  . Smoking status: Former Games developer  . Smokeless tobacco: Never Used  . Alcohol Use: No  . Drug Use: No  . Sexually Active: Not on file   Other Topics Concern  . Not on file   Social History Narrative  . No narrative on file   Review of Systems  Constitutional:       Has gained a few more pounds---can't exercise Wears seat belt  HENT: Positive for dental problem. Negative for  hearing loss, congestion, rhinorrhea and tinnitus.        Recently lost bridge and crown  Eyes: Negative for visual disturbance.       No diplopia or unilateral vision loss  Respiratory: Negative for cough, chest tightness and shortness of breath.   Cardiovascular: Positive for leg swelling. Negative for chest pain and palpitations.       Some swelling in right leg Had negative doppler for clot at urgent care  Gastrointestinal: Positive for constipation. Negative for nausea, vomiting, abdominal pain and blood in stool.       Needs stool softeners with the tramadol and hydrocodone  Genitourinary: Positive for frequency and difficulty urinating.       No incontinence Sig nocturia No recent sex  Musculoskeletal: Positive for back pain, joint swelling and arthralgias.  Skin: Negative for rash.       Has the isolated blister  Neurological: Positive for weakness and headaches. Negative for dizziness, syncope and numbness.  Hematological: Negative for adenopathy. Does not bruise/bleed easily.  Psychiatric/Behavioral: Positive for sleep disturbance and dysphoric mood. The patient is nervous/anxious.        Having trouble sleeping due to leg pain Some degree of depression---chronic pain, "marriage falling apart" (has been gradual) Plans  to separate from him Does get enjoyment from grandchildren and church Was on antidepressants ~20 years ago       Objective:   Physical Exam  Constitutional: She appears well-developed and well-nourished. No distress.  HENT:  Head: Normocephalic and atraumatic.  Right Ear: External ear normal.  Left Ear: External ear normal.  Mouth/Throat: Oropharynx is clear and moist. No oropharyngeal exudate.       TMs normal  Eyes: Conjunctivae and EOM are normal. Pupils are equal, round, and reactive to light.       Fundi benign  Neck: Normal range of motion. Neck supple. No thyromegaly present.  Cardiovascular: Normal rate, regular rhythm, normal heart sounds and  intact distal pulses.  Exam reveals no gallop.   No murmur heard. Pulmonary/Chest: Effort normal and breath sounds normal. No respiratory distress. She has no wheezes. She has no rales.  Abdominal: Soft. There is no tenderness.  Musculoskeletal: She exhibits no edema.       Deformity and effusion of right knee  Lymphadenopathy:    She has no cervical adenopathy.  Skin: No rash noted. No erythema.       Single blister along lateral right thigh  Psychiatric: Her behavior is normal. Judgment and thought content normal.       Tearful but appropriate          Assessment & Plan:

## 2011-06-17 NOTE — Assessment & Plan Note (Signed)
UTD on colon Has gyn visit coming up Will give zostavax but she doesn't want tetanus---very pain ful in the past

## 2011-06-18 LAB — MICROALBUMIN / CREATININE URINE RATIO: Creatinine,U: 103.6 mg/dL

## 2011-07-22 ENCOUNTER — Encounter: Payer: Self-pay | Admitting: Internal Medicine

## 2011-07-24 ENCOUNTER — Encounter: Payer: Self-pay | Admitting: Internal Medicine

## 2011-07-24 ENCOUNTER — Ambulatory Visit (INDEPENDENT_AMBULATORY_CARE_PROVIDER_SITE_OTHER): Payer: 59 | Admitting: Internal Medicine

## 2011-07-24 DIAGNOSIS — M479 Spondylosis, unspecified: Secondary | ICD-10-CM

## 2011-07-24 DIAGNOSIS — Z733 Stress, not elsewhere classified: Secondary | ICD-10-CM

## 2011-07-24 DIAGNOSIS — F439 Reaction to severe stress, unspecified: Secondary | ICD-10-CM

## 2011-07-24 MED ORDER — HYDROCODONE-ACETAMINOPHEN 5-500 MG PO TABS: 0.5000 | ORAL_TABLET | Freq: Three times a day (TID) | ORAL | Status: DC | PRN

## 2011-07-24 MED ORDER — TRAMADOL HCL 50 MG PO TABS: 50.0000 mg | ORAL_TABLET | Freq: Three times a day (TID) | ORAL | Status: DC | PRN

## 2011-07-24 NOTE — Assessment & Plan Note (Signed)
Ongoing pain  Tramadol was not as strong but tolerated better during the day Will refill this and have the hydrocodone for really bad times
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
## 2011-07-24 NOTE — Assessment & Plan Note (Signed)
Ongoing issues but is dealing with it No major depression No meds needed Feels she is doing okay without counselling

## 2011-07-30 ENCOUNTER — Other Ambulatory Visit: Payer: Self-pay | Admitting: *Deleted

## 2011-07-30 NOTE — Telephone Encounter (Signed)
Pt is asking for a new script for free style lite test strips and lancets.  She has never had a script before, has always used her husbands.  Uses [facility] on [address].

## 2011-07-31 NOTE — Telephone Encounter (Signed)
Okay to send Rx for a year Sig should be test daily or as directed

## 2011-08-01 MED ORDER — FREESTYLE LANCETS MISC: 1.0000 | Freq: Every day | Status: DC | PRN

## 2011-08-01 MED ORDER — GLUCOSE BLOOD VI STRP: 1.0000 | ORAL_STRIP | Freq: Every day | Status: DC | PRN

## 2011-08-01 NOTE — Telephone Encounter (Signed)
rx sent to pharmacy by e-script Spoke with patient and advised results   

## 2011-08-28 ENCOUNTER — Encounter: Payer: Self-pay | Admitting: Internal Medicine

## 2011-10-10 ENCOUNTER — Other Ambulatory Visit: Payer: Self-pay | Admitting: Internal Medicine

## 2011-10-10 DIAGNOSIS — Z1231 Encounter for screening mammogram for malignant neoplasm of breast: Secondary | ICD-10-CM

## 2011-10-31 ENCOUNTER — Ambulatory Visit
Admission: RE | Admit: 2011-10-31 | Discharge: 2011-10-31 | Disposition: A | Discharge status: Home / Self Care | Payer: 59 | Source: Ambulatory Visit | Attending: Internal Medicine | Admitting: Internal Medicine

## 2011-10-31 DIAGNOSIS — Z1231 Encounter for screening mammogram for malignant neoplasm of breast: Secondary | ICD-10-CM

## 2011-11-04 ENCOUNTER — Encounter: Payer: Self-pay | Admitting: *Deleted

## 2012-01-22 ENCOUNTER — Ambulatory Visit (INDEPENDENT_AMBULATORY_CARE_PROVIDER_SITE_OTHER): Payer: 59 | Admitting: Internal Medicine

## 2012-01-22 ENCOUNTER — Encounter: Payer: Self-pay | Admitting: Internal Medicine

## 2012-01-22 VITALS — BP 126/80 | HR 69 | Temp 97.8°F | Ht 63.0 in | Wt 231.0 lb

## 2012-01-22 DIAGNOSIS — IMO0002 Reserved for concepts with insufficient information to code with codable children: Secondary | ICD-10-CM

## 2012-01-22 DIAGNOSIS — E119 Type 2 diabetes mellitus without complications: Secondary | ICD-10-CM

## 2012-01-22 DIAGNOSIS — Z733 Stress, not elsewhere classified: Secondary | ICD-10-CM

## 2012-01-22 DIAGNOSIS — F439 Reaction to severe stress, unspecified: Secondary | ICD-10-CM

## 2012-01-22 DIAGNOSIS — M1710 Unilateral primary osteoarthritis, unspecified knee: Secondary | ICD-10-CM | POA: Insufficient documentation

## 2012-01-22 DIAGNOSIS — M179 Osteoarthritis of knee, unspecified: Secondary | ICD-10-CM

## 2012-01-22 DIAGNOSIS — E785 Hyperlipidemia, unspecified: Secondary | ICD-10-CM

## 2012-01-22 LAB — HEMOGLOBIN A1C: Hgb A1c MFr Bld: 6.7 % — ABNORMAL HIGH (ref 4.6–6.5)

## 2012-01-22 MED ORDER — DICLOFENAC SODIUM 1 % TD GEL: 1.0000 "application " | Freq: Four times a day (QID) | TRANSDERMAL | Status: DC

## 2012-01-22 NOTE — Assessment & Plan Note (Signed)
Still seems to have good control Will check a1c

## 2012-01-22 NOTE — Assessment & Plan Note (Signed)
Tramadol helps Bad at night though Will try voltaren gel

## 2012-01-22 NOTE — Assessment & Plan Note (Signed)
Ongoing marital stress Trying to decide the best way to manage this
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
## 2012-01-22 NOTE — Assessment & Plan Note (Signed)
She will try red yeast rice

## 2012-01-27 ENCOUNTER — Encounter: Payer: Self-pay | Admitting: *Deleted

## 2012-05-15 ENCOUNTER — Other Ambulatory Visit: Payer: Self-pay | Admitting: *Deleted

## 2012-05-15 MED ORDER — TRAMADOL HCL 50 MG PO TABS: 50.0000 mg | ORAL_TABLET | Freq: Three times a day (TID) | ORAL | Status: DC | PRN

## 2012-05-15 NOTE — Telephone Encounter (Signed)
rx sent to pharmacy by e-script  

## 2012-05-15 NOTE — Telephone Encounter (Signed)
Okay #90 x 0 

## 2012-07-31 ENCOUNTER — Encounter: Payer: 59 | Admitting: Internal Medicine

## 2012-09-17 ENCOUNTER — Other Ambulatory Visit: Payer: Self-pay

## 2012-09-17 MED ORDER — HYDROCODONE-ACETAMINOPHEN 5-500 MG PO TABS: 0.5000 | ORAL_TABLET | Freq: Three times a day (TID) | ORAL | Status: DC | PRN

## 2012-09-17 MED ORDER — TRAMADOL HCL 50 MG PO TABS: 50.0000 mg | ORAL_TABLET | Freq: Three times a day (TID) | ORAL | Status: DC | PRN

## 2012-09-17 NOTE — Telephone Encounter (Signed)
Okay #60 x 0 of hydrocodone  #90 x 0 of tramadol

## 2012-09-17 NOTE — Telephone Encounter (Signed)
rx called into pharmacy

## 2012-09-17 NOTE — Telephone Encounter (Signed)
pt left v/m pt has CPX 10/09/12. Request refill tramadol and hydrocodone to [facility].Please advise.

## 2012-09-30 ENCOUNTER — Other Ambulatory Visit: Payer: Self-pay | Admitting: Internal Medicine

## 2012-09-30 DIAGNOSIS — Z1231 Encounter for screening mammogram for malignant neoplasm of breast: Secondary | ICD-10-CM

## 2012-10-06 ENCOUNTER — Encounter: Payer: Self-pay | Admitting: Obstetrics and Gynecology

## 2012-10-06 ENCOUNTER — Ambulatory Visit (INDEPENDENT_AMBULATORY_CARE_PROVIDER_SITE_OTHER): Payer: 59 | Admitting: Obstetrics and Gynecology

## 2012-10-06 VITALS — BP 120/72 | Ht 63.6 in | Wt 228.0 lb

## 2012-10-06 DIAGNOSIS — Z01419 Encounter for gynecological examination (general) (routine) without abnormal findings: Secondary | ICD-10-CM

## 2012-10-06 DIAGNOSIS — Z780 Asymptomatic menopausal state: Secondary | ICD-10-CM
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
## 2012-10-09 ENCOUNTER — Ambulatory Visit (INDEPENDENT_AMBULATORY_CARE_PROVIDER_SITE_OTHER): Payer: 59 | Admitting: Internal Medicine

## 2012-10-09 ENCOUNTER — Encounter: Payer: Self-pay | Admitting: Internal Medicine

## 2012-10-09 VITALS — BP 130/80 | HR 71 | Temp 98.8°F | Ht 63.0 in | Wt 229.0 lb

## 2012-10-09 DIAGNOSIS — M179 Osteoarthritis of knee, unspecified: Secondary | ICD-10-CM

## 2012-10-09 DIAGNOSIS — F439 Reaction to severe stress, unspecified: Secondary | ICD-10-CM

## 2012-10-09 DIAGNOSIS — Z733 Stress, not elsewhere classified: Secondary | ICD-10-CM

## 2012-10-09 DIAGNOSIS — M1710 Unilateral primary osteoarthritis, unspecified knee: Secondary | ICD-10-CM

## 2012-10-09 DIAGNOSIS — Z0000 Encounter for general adult medical examination without abnormal findings: Secondary | ICD-10-CM

## 2012-10-09 DIAGNOSIS — E119 Type 2 diabetes mellitus without complications: Secondary | ICD-10-CM

## 2012-10-09 DIAGNOSIS — IMO0002 Reserved for concepts with insufficient information to code with codable children: Secondary | ICD-10-CM

## 2012-10-09 LAB — CBC WITH DIFFERENTIAL/PLATELET
Basophils Absolute: 0 10*3/uL (ref 0.0–0.1)
Eosinophils Absolute: 0.1 10*3/uL (ref 0.0–0.7)
Eosinophils Relative: 3 % (ref 0–5)
Lymphocytes Relative: 35 % (ref 12–46)
Lymphs Abs: 1.8 10*3/uL (ref 0.7–4.0)
MCH: 26.7 pg (ref 26.0–34.0)
Neutrophils Relative %: 53 % (ref 43–77)
Platelets: 231 10*3/uL (ref 150–400)
RBC: 4.76 MIL/uL (ref 3.87–5.11)
RDW: 15.3 % (ref 11.5–15.5)
WBC: 5.2 10*3/uL (ref 4.0–10.5)

## 2012-10-09 LAB — LIPID PANEL
HDL: 57 mg/dL (ref >39)
Total CHOL/HDL Ratio: 2.7 Ratio
VLDL: 7 mg/dL (ref 0–40)

## 2012-10-09 LAB — BASIC METABOLIC PANEL
BUN: 15 mg/dL (ref 6–23)
Creat: 0.97 mg/dL (ref 0.50–1.10)
Glucose, Bld: 119 mg/dL — ABNORMAL HIGH (ref 70–99)

## 2012-10-09 LAB — TSH: TSH: 0.501 u[IU]/mL (ref 0.350–4.500)

## 2012-10-09 LAB — HEPATIC FUNCTION PANEL
ALT: 14 U/L (ref 0–35)
Indirect Bilirubin: 0.4 mg/dL (ref 0.0–0.9)
Total Protein: 6.7 g/dL (ref 6.0–8.3)

## 2012-10-09 LAB — HM DIABETES FOOT EXAM

## 2012-10-09 NOTE — Assessment & Plan Note (Signed)
And back pain Uses the tramadol regularly and rarely the hydrocodone
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
## 2012-10-09 NOTE — Assessment & Plan Note (Signed)
Lab Results  Component Value Date   HGBA1C 6.7* 01/22/2012   Good control without meds Discussed lifestyle

## 2012-10-09 NOTE — Assessment & Plan Note (Signed)
Doing okay Gyn handles pap (due 2015) and mammo Discussed fitness
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
## 2012-10-10 LAB — MICROALBUMIN / CREATININE URINE RATIO
Creatinine, Urine: 160.3 mg/dL
Microalb, Ur: 0.5 mg/dL (ref 0.00–1.89)

## 2012-10-22 ENCOUNTER — Encounter: Payer: 59 | Admitting: Obstetrics and Gynecology

## 2012-11-02 ENCOUNTER — Ambulatory Visit
Admission: RE | Admit: 2012-11-02 | Discharge: 2012-11-02 | Disposition: A | Discharge status: Home / Self Care | Payer: 59 | Source: Ambulatory Visit | Attending: Internal Medicine | Admitting: Internal Medicine

## 2012-11-02 DIAGNOSIS — Z1231 Encounter for screening mammogram for malignant neoplasm of breast: Secondary | ICD-10-CM

## 2012-12-19 ENCOUNTER — Other Ambulatory Visit: Payer: Self-pay

## 2012-12-26 ENCOUNTER — Other Ambulatory Visit: Payer: Self-pay | Admitting: Internal Medicine

## 2013-01-27 LAB — HM DIABETES EYE EXAM

## 2013-02-02 ENCOUNTER — Encounter: Payer: Self-pay | Admitting: Internal Medicine

## 2013-02-15 ENCOUNTER — Encounter: Payer: 59 | Admitting: Internal Medicine

## 2013-06-01 ENCOUNTER — Other Ambulatory Visit: Payer: Self-pay | Admitting: Internal Medicine

## 2013-06-02 NOTE — Telephone Encounter (Signed)
rx called into pharmacy

## 2013-06-02 NOTE — Telephone Encounter (Signed)
Okay #90 x 0 

## 2013-09-09 ENCOUNTER — Other Ambulatory Visit: Payer: Self-pay

## 2013-10-11 ENCOUNTER — Ambulatory Visit (INDEPENDENT_AMBULATORY_CARE_PROVIDER_SITE_OTHER): Payer: 59 | Admitting: Internal Medicine

## 2013-10-11 ENCOUNTER — Encounter: Payer: Self-pay | Admitting: Radiology

## 2013-10-11 ENCOUNTER — Encounter: Payer: Self-pay | Admitting: Internal Medicine

## 2013-10-11 VITALS — BP 138/84 | HR 64 | Temp 98.4°F | Ht 63.0 in | Wt 218.0 lb

## 2013-10-11 DIAGNOSIS — Z0000 Encounter for general adult medical examination without abnormal findings: Secondary | ICD-10-CM

## 2013-10-11 DIAGNOSIS — E1149 Type 2 diabetes mellitus with other diabetic neurological complication: Secondary | ICD-10-CM

## 2013-10-11 DIAGNOSIS — E1142 Type 2 diabetes mellitus with diabetic polyneuropathy: Secondary | ICD-10-CM

## 2013-10-11 DIAGNOSIS — M479 Spondylosis, unspecified: Secondary | ICD-10-CM

## 2013-10-11 LAB — HM DIABETES FOOT EXAM

## 2013-10-11 MED ORDER — HYDROCODONE-ACETAMINOPHEN 5-325 MG PO TABS: 1.0000 | ORAL_TABLET | Freq: Three times a day (TID) | ORAL | Status: DC | PRN

## 2013-10-11 NOTE — Assessment & Plan Note (Signed)
And knees Uses the tramadol and hydrocodone sparingly

## 2013-10-11 NOTE — Assessment & Plan Note (Signed)
Seems to still have good control with out meds Numbness with neuropathic pain Will check labs
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
## 2013-10-11 NOTE — Progress Notes (Signed)
Pre-visit discussion using our clinic review tool. No additional management support is needed unless otherwise documented below in the visit note.  

## 2013-10-11 NOTE — Assessment & Plan Note (Signed)
UTD with colon Discussed mammogram (every 2 years okay) Doesn't want imms

## 2013-10-11 NOTE — Assessment & Plan Note (Signed)
Not painful meds not needed

## 2013-10-12 LAB — CBC WITH DIFFERENTIAL/PLATELET
Basophils Absolute: 0 10*3/uL (ref 0.0–0.1)
Eosinophils Absolute: 0.2 10*3/uL (ref 0.0–0.7)
HCT: 38.6 % (ref 36.0–46.0)
Hemoglobin: 12.6 g/dL (ref 12.0–15.0)
Lymphs Abs: 1.9 10*3/uL (ref 0.7–4.0)
MCHC: 32.7 g/dL (ref 30.0–36.0)
Monocytes Absolute: 0.5 10*3/uL (ref 0.1–1.0)
Monocytes Relative: 9.3 % (ref 3.0–12.0)
Neutro Abs: 2.7 10*3/uL (ref 1.4–7.7)
Platelets: 189 10*3/uL (ref 150.0–400.0)
RDW: 15.1 % — ABNORMAL HIGH (ref 11.5–14.6)

## 2013-10-12 LAB — HEMOGLOBIN A1C: Hgb A1c MFr Bld: 6.6 % — ABNORMAL HIGH (ref 4.6–6.5)

## 2013-10-12 LAB — MICROALBUMIN / CREATININE URINE RATIO: Microalb Creat Ratio: 0.6 mg/g (ref 0.0–30.0)

## 2013-10-13 LAB — LIPID PANEL
Cholesterol: 180 mg/dL (ref 0–200)
VLDL: 4.2 mg/dL (ref 0.0–40.0)

## 2013-10-13 LAB — HEPATIC FUNCTION PANEL
ALT: 20 U/L (ref 0–35)
Alkaline Phosphatase: 79 U/L (ref 39–117)
Bilirubin, Direct: 0.1 mg/dL (ref 0.0–0.3)
Total Bilirubin: 0.5 mg/dL (ref 0.3–1.2)
Total Protein: 7.3 g/dL (ref 6.0–8.3)

## 2013-10-13 LAB — BASIC METABOLIC PANEL
CO2: 30 mEq/L (ref 19–32)
Calcium: 9.5 mg/dL (ref 8.4–10.5)
Chloride: 105 mEq/L (ref 96–112)
Glucose, Bld: 137 mg/dL — ABNORMAL HIGH (ref 70–99)
Potassium: 4.3 mEq/L (ref 3.5–5.1)
Sodium: 142 mEq/L (ref 135–145)

## 2013-10-13 LAB — HM PAP SMEAR: HM PAP: NORMAL

## 2013-10-13 LAB — T4, FREE: Free T4: 1.1 ng/dL (ref 0.60–1.60)

## 2013-10-14 ENCOUNTER — Other Ambulatory Visit: Payer: Self-pay

## 2013-10-14 DIAGNOSIS — Z1231 Encounter for screening mammogram for malignant neoplasm of breast: Secondary | ICD-10-CM

## 2013-11-03 ENCOUNTER — Encounter: Payer: Self-pay | Admitting: Internal Medicine

## 2013-11-17 ENCOUNTER — Ambulatory Visit: Payer: 59

## 2013-11-19 ENCOUNTER — Encounter: Payer: Self-pay | Admitting: Internal Medicine

## 2013-12-03 ENCOUNTER — Ambulatory Visit
Admission: RE | Admit: 2013-12-03 | Discharge: 2013-12-03 | Disposition: A | Discharge status: Home / Self Care | Payer: Self-pay | Source: Ambulatory Visit

## 2013-12-03 DIAGNOSIS — Z1231 Encounter for screening mammogram for malignant neoplasm of breast: Secondary | ICD-10-CM

## 2014-02-02 LAB — HM DIABETES EYE EXAM

## 2014-02-16 ENCOUNTER — Encounter: Payer: Self-pay | Admitting: Internal Medicine

## 2014-04-12 ENCOUNTER — Ambulatory Visit: Payer: 59 | Admitting: Internal Medicine

## 2014-04-29 ENCOUNTER — Encounter: Payer: Self-pay | Admitting: Internal Medicine

## 2014-04-29 ENCOUNTER — Ambulatory Visit (INDEPENDENT_AMBULATORY_CARE_PROVIDER_SITE_OTHER): Payer: 59 | Admitting: Internal Medicine

## 2014-04-29 VITALS — BP 128/70 | HR 72 | Temp 98.4°F | Wt 208.0 lb

## 2014-04-29 DIAGNOSIS — E1142 Type 2 diabetes mellitus with diabetic polyneuropathy: Secondary | ICD-10-CM

## 2014-04-29 DIAGNOSIS — E1149 Type 2 diabetes mellitus with other diabetic neurological complication: Secondary | ICD-10-CM

## 2014-04-29 DIAGNOSIS — M1710 Unilateral primary osteoarthritis, unspecified knee: Secondary | ICD-10-CM

## 2014-04-29 DIAGNOSIS — M170 Bilateral primary osteoarthritis of knee: Secondary | ICD-10-CM

## 2014-04-29 LAB — HEMOGLOBIN A1C: HEMOGLOBIN A1C: 6.5 % (ref 4.6–6.5)

## 2014-04-29 MED ORDER — TRAMADOL HCL 50 MG PO TABS: 50.0000 mg | ORAL_TABLET | Freq: Three times a day (TID) | ORAL | Status: DC | PRN

## 2014-04-29 NOTE — Assessment & Plan Note (Signed)
Mild numbness in feet  Also in hands but may have some CTS

## 2014-04-29 NOTE — Assessment & Plan Note (Signed)
Seems to still have good control Due for labs

## 2014-04-29 NOTE — Progress Notes (Signed)
Pre visit review using our clinic review tool, if applicable. No additional management support is needed unless otherwise documented below in the visit note. 
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
## 2014-04-29 NOTE — Assessment & Plan Note (Signed)
Does okay with rare tramadol Limits her at times but not too bad

## 2014-05-20 ENCOUNTER — Telehealth: Payer: Self-pay | Admitting: Family Medicine

## 2014-05-20 NOTE — Telephone Encounter (Signed)
Diabetic Bundle.  Pt needs lab appt for LDL.  Pt will call back to schedule appt.

## 2014-07-19 ENCOUNTER — Other Ambulatory Visit: Payer: Self-pay | Admitting: Internal Medicine

## 2014-07-26 ENCOUNTER — Other Ambulatory Visit: Payer: Self-pay | Admitting: *Deleted

## 2014-07-26 ENCOUNTER — Other Ambulatory Visit: Payer: Self-pay | Admitting: Internal Medicine

## 2014-09-05 ENCOUNTER — Encounter: Payer: Self-pay | Admitting: Internal Medicine

## 2014-10-12 ENCOUNTER — Telehealth: Payer: Self-pay | Admitting: *Deleted

## 2014-10-12 MED ORDER — TRAMADOL HCL 50 MG PO TABS: 50.0000 mg | ORAL_TABLET | Freq: Three times a day (TID) | ORAL | Status: DC | PRN

## 2014-10-12 NOTE — Telephone Encounter (Signed)
Approved: #90 x 0 Check if she is due for follow up (or if I said 1 year at her last appt)

## 2014-10-12 NOTE — Telephone Encounter (Signed)
rx called into pharmacy

## 2014-10-12 NOTE — Telephone Encounter (Signed)
04/29/14 

## 2014-10-14 NOTE — Telephone Encounter (Signed)
Pt called said [facility] did not have tramadol refill; spoke with Joellen Jersey at [facility] and tramadol refill is ready for pick up. Left v/m notifying pt that tramadol is ready for pick up. Pt also left v/m wanting to know when next appt is, see Dr Everardo Beals note on 10/12/14.

## 2014-10-17 NOTE — Telephone Encounter (Signed)
Left message on machine that pt is due a follow-up 6 mths from her June 2015 visit, advised her to call to schedule for a cpx.

## 2014-10-25 ENCOUNTER — Encounter: Payer: Self-pay | Admitting: Internal Medicine

## 2014-12-05 ENCOUNTER — Encounter: Payer: Self-pay | Admitting: Gastroenterology

## 2015-03-01 ENCOUNTER — Encounter: Payer: Self-pay | Admitting: Internal Medicine

## 2015-03-01 ENCOUNTER — Ambulatory Visit (INDEPENDENT_AMBULATORY_CARE_PROVIDER_SITE_OTHER): Payer: 59 | Admitting: Internal Medicine

## 2015-03-01 VITALS — BP 128/78 | HR 63 | Temp 98.1°F | Ht 63.0 in | Wt 221.0 lb

## 2015-03-01 DIAGNOSIS — M5136 Other intervertebral disc degeneration, lumbar region: Secondary | ICD-10-CM | POA: Diagnosis not present

## 2015-03-01 DIAGNOSIS — G629 Polyneuropathy, unspecified: Secondary | ICD-10-CM | POA: Diagnosis not present

## 2015-03-01 DIAGNOSIS — Z658 Other specified problems related to psychosocial circumstances: Secondary | ICD-10-CM

## 2015-03-01 DIAGNOSIS — E1140 Type 2 diabetes mellitus with diabetic neuropathy, unspecified: Secondary | ICD-10-CM

## 2015-03-01 DIAGNOSIS — F439 Reaction to severe stress, unspecified: Secondary | ICD-10-CM

## 2015-03-01 DIAGNOSIS — E1149 Type 2 diabetes mellitus with other diabetic neurological complication: Secondary | ICD-10-CM

## 2015-03-01 LAB — CBC WITH DIFFERENTIAL/PLATELET
BASOS ABS: 0 10*3/uL (ref 0.0–0.1)
Basophils Relative: 0.7 % (ref 0.0–3.0)
Eosinophils Absolute: 0.3 10*3/uL (ref 0.0–0.7)
Eosinophils Relative: 5.9 % — ABNORMAL HIGH (ref 0.0–5.0)
HCT: 38.8 % (ref 36.0–46.0)
Hemoglobin: 12.8 g/dL (ref 12.0–15.0)
LYMPHS ABS: 1.8 10*3/uL (ref 0.7–4.0)
Lymphocytes Relative: 38.3 % (ref 12.0–46.0)
MCHC: 32.9 g/dL (ref 30.0–36.0)
MCV: 83.1 fl (ref 78.0–100.0)
MONO ABS: 0.5 10*3/uL (ref 0.1–1.0)
Monocytes Relative: 10 % (ref 3.0–12.0)
NEUTROS PCT: 45.1 % (ref 43.0–77.0)
Neutro Abs: 2.2 10*3/uL (ref 1.4–7.7)
PLATELETS: 217 10*3/uL (ref 150.0–400.0)
RBC: 4.67 Mil/uL (ref 3.87–5.11)
RDW: 16.1 % — ABNORMAL HIGH (ref 11.5–15.5)
WBC: 4.8 10*3/uL (ref 4.0–10.5)

## 2015-03-01 LAB — COMPREHENSIVE METABOLIC PANEL
ALBUMIN: 4.1 g/dL (ref 3.5–5.2)
ALK PHOS: 93 U/L (ref 39–117)
ALT: 15 U/L (ref 0–35)
AST: 21 U/L (ref 0–37)
BILIRUBIN TOTAL: 0.5 mg/dL (ref 0.2–1.2)
BUN: 19 mg/dL (ref 6–23)
CO2: 32 meq/L (ref 19–32)
Calcium: 10 mg/dL (ref 8.4–10.5)
Chloride: 104 mEq/L (ref 96–112)
Creatinine, Ser: 1 mg/dL (ref 0.40–1.20)
GFR: 71.68 mL/min (ref >60.00)
GLUCOSE: 103 mg/dL — AB (ref 70–99)
POTASSIUM: 4.5 meq/L (ref 3.5–5.1)
SODIUM: 138 meq/L (ref 135–145)
TOTAL PROTEIN: 7.4 g/dL (ref 6.0–8.3)

## 2015-03-01 LAB — LIPID PANEL
CHOLESTEROL: 175 mg/dL (ref 0–200)
HDL: 70.3 mg/dL (ref >39.00)
LDL CALC: 100 mg/dL — AB (ref 0–99)
NonHDL: 104.7
TRIGLYCERIDES: 24 mg/dL (ref 0.0–149.0)
Total CHOL/HDL Ratio: 2
VLDL: 4.8 mg/dL (ref 0.0–40.0)

## 2015-03-01 LAB — MICROALBUMIN / CREATININE URINE RATIO
Creatinine,U: 78.5 mg/dL
MICROALB/CREAT RATIO: 0.9 mg/g (ref 0.0–30.0)

## 2015-03-01 LAB — T4, FREE: Free T4: 0.82 ng/dL (ref 0.60–1.60)

## 2015-03-01 LAB — HM DIABETES FOOT EXAM

## 2015-03-01 LAB — HEMOGLOBIN A1C: HEMOGLOBIN A1C: 6.6 % — AB (ref 4.6–6.5)

## 2015-03-01 MED ORDER — HYDROCODONE-ACETAMINOPHEN 5-325 MG PO TABS: 1.0000 | ORAL_TABLET | Freq: Two times a day (BID) | ORAL | Status: DC | PRN

## 2015-03-01 MED ORDER — DICLOFENAC SODIUM 1 % TD GEL: 2.0000 g | Freq: Four times a day (QID) | TRANSDERMAL | Status: DC

## 2015-03-01 MED ORDER — GABAPENTIN 300 MG PO CAPS: 300.0000 mg | ORAL_CAPSULE | Freq: Every day | ORAL | Status: DC

## 2015-03-01 NOTE — Progress Notes (Signed)
Pre visit review using our clinic review tool, if applicable. No additional management support is needed unless otherwise documented below in the visit note. 

## 2015-03-01 NOTE — Assessment & Plan Note (Signed)
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

## 2015-03-01 NOTE — Progress Notes (Signed)
Subjective:    Patient ID: Heather Shaffer, female    DOB: [birth year], 64 y.o.   MRN: [id number]  HPI Here for follow up of diabetes and other medical conditions  Still separated No overt problems with husband For now will stay legally separated Not seeking divorce due to her church's take on it [facility]) Still sees him with some celebrations with grandkids  Checks sugars infrequently but are okay Tries to be compliant with eating--only 135 even after ice cream Usually under 120 Still with abnormal sensations in hands and legs. Unsure if diabetic, neuropathic from back or diabetes Has some left over hydrocodone--not using this much Uses tramadol mostly  Current Outpatient Prescriptions on File Prior to Visit  Medication Sig Dispense Refill  . glucose blood (FREESTYLE LITE) test strip Use to test blood sugar once daily dx: 250.60 100 each 3  . Lancets (FREESTYLE) lancets USE DAILY AS NEEDED 100 each 11  . traMADol (ULTRAM) 50 MG tablet Take 1 tablet (50 mg total) by mouth 3 (three) times daily as needed. 90 tablet 0   No current facility-administered medications on file prior to visit.    Allergies  Allergen Reactions  . Sulfamethoxazole-Trimethoprim     REACTION: tongue swells  . Tdap [Diphth-Acell Pertussis-Tetanus] Rash    Past Medical History  Diagnosis Date  . Diabetes mellitus   . DVT (deep venous thrombosis)     hx of 1998 post knee replacement  . Back pain     Back injections at [facility] for back pain &  spinal stenosis  . Barrett's esophagus   . Carpal tunnel syndrome   . History of nephrolithiasis   . Uterine fibroids affecting pregnancy   . Diverticulitis   . Hemorrhoids   . Hiatal hernia   . Osteoarthritis, knee   . Allergy     Past Surgical History  Procedure Laterality Date  . Total knee arthroplasty      right  . Laparoscopy      Family History  Problem Relation Age of Onset  . Cancer Sister   . Diabetes Son   . Heart disease  Son     cardiomyopathy    History   Social History  . Marital Status: Legally Separated    Spouse Name: N/A  . Number of Children: 3  . Years of Education: N/A   Occupational History  . Realtor, Art therapist     retired  . [employer]     retired   Social History Main Topics  . Smoking status: Former Research scientist (life sciences)  . Smokeless tobacco: Never Used     Comment: quit 1975  . Alcohol Use: No  . Drug Use: No  . Sexual Activity: Not Currently    Birth Control/ Protection: Post-menopausal   Other Topics Concern  . Not on file   Social History Narrative   Review of Systems Still not sleeping well at night--nerve pain Weight is up again--not consistent with exercise Gets swelling in legs at night-- and actually improves during the day    Objective:   Physical Exam  Constitutional: She appears well-developed and well-nourished. No distress.  Neck: Normal range of motion. Neck supple. No thyromegaly present.  Cardiovascular: Normal rate, regular rhythm, normal heart sounds and intact distal pulses.  Exam reveals no gallop.   No murmur heard. Pulmonary/Chest: Effort normal and breath sounds normal. No respiratory distress. She has no wheezes. She has no rales.  Musculoskeletal:  Trace edema Flat feet  Lymphadenopathy:  She has no cervical adenopathy.  Neurological:  Very little sensation in feet  Psychiatric: She has a normal mood and affect. Her behavior is normal.          Assessment & Plan:

## 2015-03-01 NOTE — Assessment & Plan Note (Signed)
Pain is severe at times Uses tramadol and rarely the hydrocodone

## 2015-03-01 NOTE — Assessment & Plan Note (Signed)
Better Comfortable with separation and little contact with ex

## 2015-03-01 NOTE — Assessment & Plan Note (Signed)
Seems to still have good control without meds Due for labs

## 2015-03-01 NOTE — Patient Instructions (Signed)
Please try the gabapentin 1 capsule at bedtime to help the pain. If you have no problems with it, but still have pain after 3-4 days, go up to 2 capsules.

## 2015-03-01 NOTE — Addendum Note (Signed)
Addended by: Despina Hidden on: 03/01/2015 12:13 PM   Modules accepted: Orders, Medications

## 2015-03-02 ENCOUNTER — Telehealth: Payer: Self-pay | Admitting: Internal Medicine

## 2015-03-03 ENCOUNTER — Encounter: Payer: Self-pay | Admitting: Internal Medicine

## 2015-03-06 LAB — HM DIABETES EYE EXAM

## 2015-04-17 ENCOUNTER — Other Ambulatory Visit: Payer: Self-pay | Admitting: *Deleted

## 2015-04-17 NOTE — Telephone Encounter (Signed)
10/12/2014 

## 2015-04-17 NOTE — Telephone Encounter (Signed)
Approved: okay #90 x 0 

## 2015-04-18 MED ORDER — TRAMADOL HCL 50 MG PO TABS: 50.0000 mg | ORAL_TABLET | Freq: Three times a day (TID) | ORAL | Status: DC | PRN

## 2015-04-18 NOTE — Telephone Encounter (Signed)
rx called into pharmacy

## 2015-08-03 ENCOUNTER — Other Ambulatory Visit: Payer: Self-pay | Admitting: *Deleted

## 2015-08-03 MED ORDER — TRAMADOL HCL 50 MG PO TABS: 50.0000 mg | ORAL_TABLET | Freq: Three times a day (TID) | ORAL | Status: DC | PRN

## 2015-08-03 NOTE — Telephone Encounter (Signed)
04/18/15 

## 2015-08-03 NOTE — Telephone Encounter (Signed)
rx called into pharmacy

## 2015-08-03 NOTE — Telephone Encounter (Signed)
Approved: #90 x 0 

## 2015-10-19 ENCOUNTER — Ambulatory Visit (INDEPENDENT_AMBULATORY_CARE_PROVIDER_SITE_OTHER): Payer: Medicare Other | Admitting: Internal Medicine

## 2015-10-19 ENCOUNTER — Encounter: Payer: Self-pay | Admitting: Internal Medicine

## 2015-10-19 VITALS — BP 120/70 | HR 62 | Temp 98.0°F | Ht 63.0 in | Wt 217.0 lb

## 2015-10-19 DIAGNOSIS — G5600 Carpal tunnel syndrome, unspecified upper limb: Secondary | ICD-10-CM | POA: Insufficient documentation

## 2015-10-19 DIAGNOSIS — G5603 Carpal tunnel syndrome, bilateral upper limbs: Secondary | ICD-10-CM

## 2015-10-19 DIAGNOSIS — M5136 Other intervertebral disc degeneration, lumbar region: Secondary | ICD-10-CM

## 2015-10-19 DIAGNOSIS — G629 Polyneuropathy, unspecified: Secondary | ICD-10-CM

## 2015-10-19 DIAGNOSIS — Z1211 Encounter for screening for malignant neoplasm of colon: Secondary | ICD-10-CM | POA: Diagnosis not present

## 2015-10-19 DIAGNOSIS — Z0000 Encounter for general adult medical examination without abnormal findings: Secondary | ICD-10-CM | POA: Diagnosis not present

## 2015-10-19 DIAGNOSIS — Z7189 Other specified counseling: Secondary | ICD-10-CM | POA: Insufficient documentation

## 2015-10-19 DIAGNOSIS — E1149 Type 2 diabetes mellitus with other diabetic neurological complication: Secondary | ICD-10-CM

## 2015-10-19 LAB — HM DIABETES FOOT EXAM

## 2015-10-19 MED ORDER — GABAPENTIN 300 MG PO CAPS: 300.0000 mg | ORAL_CAPSULE | Freq: Every day | ORAL | Status: DC

## 2015-10-19 MED ORDER — HYDROCODONE-ACETAMINOPHEN 5-325 MG PO TABS: 1.0000 | ORAL_TABLET | Freq: Two times a day (BID) | ORAL | Status: DC | PRN

## 2015-10-19 NOTE — Progress Notes (Signed)
Subjective:    Patient ID: Heather Shaffer, female    DOB: May 09, 1951, 64 y.o.   MRN: [id number]  HPI Here for initial Medicare wellness and follow up of chronic medical conditions Reviewed form and advanced directives Reviewed other doctors No alcohol or tobacco Tries to do exercise on glider Vision is okay. Gets chronic tinnitus but hearing is okay. No falls No depression---just stress (like someone trying to break into garage). Not anhedonic Independent in instrumental ADLs Mild concerns about her memory--forgets what she went to get  Having more trouble with her back Pain is very bad Tramadol and hydrocodone not helping Had been at pain clinic at [facility] years ago  Checks sugars occasionally Usually 100-138 No hypoglycemic reactions Numbness in feet--but not painful  Did hurt foot and cut it a while back--dropped something on foot 2 months ago. Still not healed great toe Did try gabapentin briefly --- knocked her out the next day somewhat (1 was okay though)  Current Outpatient Prescriptions on File Prior to Visit  Medication Sig Dispense Refill  . diclofenac sodium (VOLTAREN) 1 % GEL Apply 2 g topically 4 (four) times daily. 100 g 3  . gabapentin (NEURONTIN) 300 MG capsule Take 1-2 capsules (300-600 mg total) by mouth at bedtime. 60 capsule 11  . glucose blood (FREESTYLE LITE) test strip Use to test blood sugar once daily dx: 250.60 100 each 3  . HYDROcodone-acetaminophen (NORCO/VICODIN) 5-325 MG per tablet Take 1 tablet by mouth 2 (two) times daily as needed for moderate pain. 30 tablet 0  . Lancets (FREESTYLE) lancets USE DAILY AS NEEDED 100 each 11  . traMADol (ULTRAM) 50 MG tablet Take 1 tablet (50 mg total) by mouth 3 (three) times daily as needed. 90 tablet 0   No current facility-administered medications on file prior to visit.    Allergies  Allergen Reactions  . Sulfamethoxazole-Trimethoprim     REACTION: tongue swells  . Tdap [Diphth-Acell Pertussis-Tetanus]  Rash    Past Medical History  Diagnosis Date  . Diabetes mellitus   . DVT (deep venous thrombosis) (HCC)     hx of 1998 post knee replacement  . Back pain     Back injections at [facility] for back pain &  spinal stenosis  . Barrett's esophagus   . Carpal tunnel syndrome   . History of nephrolithiasis   . Uterine fibroids affecting pregnancy   . Diverticulitis   . Hemorrhoids   . Hiatal hernia   . Osteoarthritis, knee   . Allergy     Past Surgical History  Procedure Laterality Date  . Total knee arthroplasty      right  . Laparoscopy      Family History  Problem Relation Age of Onset  . Cancer Sister   . Diabetes Son   . Heart disease Son     cardiomyopathy    Social History   Social History  . Marital Status: Legally Separated    Spouse Name: N/A  . Number of Children: 3  . Years of Education: N/A   Occupational History  . Realtor, Art therapist     retired  . [employer]     retired   Social History Main Topics  . Smoking status: Former Research scientist (life sciences)  . Smokeless tobacco: Never Used     Comment: quit 1975  . Alcohol Use: No  . Drug Use: No  . Sexual Activity: Not Currently    Birth Control/ Protection: Post-menopausal   Other Topics Concern  .  Not on file   Social History Narrative   No living will   Daughter is health care POA Arsenio Loader)   Would accept resuscitation   Would probably accept tube feeds   Review of Systems Right hand numbness and some in left--was diagnosed with bilateral carpal tunnel. Affecting her ability to do things Some right knee pain still--no worse Sleeps poorly--due to pain Appetite is good Lost 4# since last visit Bowels are fine with OTC laxative (due to the narcotics)---senna Still sees gynecologist---getting mammo and pap soon Teeth problems--lots of work that hasn't held up No rash or suspicious skin lesions    Objective:   Physical Exam  Constitutional: She is oriented to person, place, and time. She  appears well-developed and well-nourished. No distress.  HENT:  Mouth/Throat: Oropharynx is clear and moist. No oropharyngeal exudate.  Neck: Normal range of motion. Neck supple. No thyromegaly present.  Cardiovascular: Normal rate, regular rhythm, normal heart sounds and intact distal pulses.  Exam reveals no gallop.   No murmur heard. Pulmonary/Chest: Effort normal and breath sounds normal. No respiratory distress. She has no wheezes. She has no rales.  Abdominal: Soft. There is no tenderness.  Musculoskeletal:  Flat foot on right--bony prominence at distal part there by ankle Knees are thick  Lymphadenopathy:    She has no cervical adenopathy.  Neurological: She is alert and oriented to person, place, and time.  President -- "Barack Obama, Clinton--then Bushes" [phone] [id number] Recall 2/3  Decreased sensation in feet  Skin: No rash noted.  No foot lesions Has well granulated cut on right great toe  Psychiatric: She has a normal mood and affect. Her behavior is normal.          Assessment & Plan:

## 2015-10-19 NOTE — Assessment & Plan Note (Signed)
I have personally reviewed the Medicare Annual Wellness questionnaire and have noted 1. The patient's medical and social history 2. Their use of alcohol, tobacco or illicit drugs 3. Their current medications and supplements 4. The patient's functional ability including ADL's, fall risks, home safety risks and hearing or visual             impairment. 5. Diet and physical activities 6. Evidence for depression or mood disorders  The patients weight, height, BMI and visual acuity have been recorded in the chart I have made referrals, counseling and provided education to the patient based review of the above and I have provided the pt with a written personalized care plan for preventive services.  I have provided you with a copy of your personalized plan for preventive services. Please take the time to review along with your updated medication list.  Won't take any vaccines Doesn't want colonoscopy--will do fecal immunoassay Had DEXA and was normal a few years ago Seeing gyn for mammo and pap soon

## 2015-10-19 NOTE — Progress Notes (Signed)
Pre visit review using our clinic review tool, if applicable. No additional management support is needed unless otherwise documented below in the visit note. 

## 2015-10-19 NOTE — Assessment & Plan Note (Signed)
Hopefully still good control Due for A1c 

## 2015-10-19 NOTE — Assessment & Plan Note (Signed)
Worsened symptoms Will hold off on referral for now---she has decided to wait and look into her back first

## 2015-10-19 NOTE — Assessment & Plan Note (Signed)
Probably both diabetes and spinal stenosis ?early Charcot joint on right

## 2015-10-19 NOTE — Assessment & Plan Note (Signed)
Worsened pain Will refer to Dr Sharlet Salina

## 2015-10-19 NOTE — Assessment & Plan Note (Signed)
See social history 

## 2015-10-19 NOTE — Addendum Note (Signed)
Addended by: Viviana Simpler I on: 10/19/2015 04:48 PM   Modules accepted: Orders

## 2015-10-20 ENCOUNTER — Encounter: Payer: Self-pay | Admitting: Radiology

## 2015-10-20 LAB — HEMOGLOBIN A1C: Hgb A1c MFr Bld: 6.6 % — ABNORMAL HIGH (ref 4.6–6.5)

## 2015-10-23 ENCOUNTER — Encounter: Payer: Self-pay | Admitting: *Deleted

## 2015-10-25 DIAGNOSIS — Z01411 Encounter for gynecological examination (general) (routine) with abnormal findings: Secondary | ICD-10-CM | POA: Diagnosis not present

## 2015-10-25 DIAGNOSIS — D259 Leiomyoma of uterus, unspecified: Secondary | ICD-10-CM | POA: Diagnosis not present

## 2015-10-25 DIAGNOSIS — Z1231 Encounter for screening mammogram for malignant neoplasm of breast: Secondary | ICD-10-CM | POA: Diagnosis not present

## 2016-01-18 ENCOUNTER — Other Ambulatory Visit (INDEPENDENT_AMBULATORY_CARE_PROVIDER_SITE_OTHER): Payer: Medicare Other

## 2016-01-18 DIAGNOSIS — Z1211 Encounter for screening for malignant neoplasm of colon: Secondary | ICD-10-CM | POA: Diagnosis not present

## 2016-01-18 LAB — FECAL OCCULT BLOOD, IMMUNOCHEMICAL: FECAL OCCULT BLD: NEGATIVE

## 2016-02-05 ENCOUNTER — Other Ambulatory Visit: Payer: Self-pay

## 2016-02-05 MED ORDER — TRAMADOL HCL 50 MG PO TABS: 50.0000 mg | ORAL_TABLET | Freq: Three times a day (TID) | ORAL | Status: DC | PRN

## 2016-02-05 NOTE — Telephone Encounter (Signed)
Last refill 08/04/15 #90/0 Last OV 10-19-15 No new ov

## 2016-02-05 NOTE — Telephone Encounter (Signed)
Spoke to [employer] at Pharmacy with refill

## 2016-02-05 NOTE — Telephone Encounter (Signed)
Approved: #90 x 0 

## 2016-02-08 ENCOUNTER — Encounter (HOSPITAL_COMMUNITY): Payer: Self-pay | Admitting: Emergency Medicine

## 2016-02-08 ENCOUNTER — Emergency Department (HOSPITAL_COMMUNITY): Payer: Medicare Other

## 2016-02-08 ENCOUNTER — Emergency Department (HOSPITAL_COMMUNITY)
Admission: EM | Admit: 2016-02-08 | Discharge: 2016-02-08 | Disposition: A | Discharge status: Home / Self Care | Payer: Medicare Other | Attending: Emergency Medicine | Admitting: Emergency Medicine

## 2016-02-08 DIAGNOSIS — R1031 Right lower quadrant pain: Secondary | ICD-10-CM | POA: Diagnosis not present

## 2016-02-08 DIAGNOSIS — Z87442 Personal history of urinary calculi: Secondary | ICD-10-CM | POA: Diagnosis not present

## 2016-02-08 DIAGNOSIS — M545 Low back pain: Secondary | ICD-10-CM | POA: Diagnosis not present

## 2016-02-08 DIAGNOSIS — M179 Osteoarthritis of knee, unspecified: Secondary | ICD-10-CM | POA: Insufficient documentation

## 2016-02-08 DIAGNOSIS — Z87891 Personal history of nicotine dependence: Secondary | ICD-10-CM | POA: Insufficient documentation

## 2016-02-08 DIAGNOSIS — Z86718 Personal history of other venous thrombosis and embolism: Secondary | ICD-10-CM | POA: Diagnosis not present

## 2016-02-08 DIAGNOSIS — R109 Unspecified abdominal pain: Secondary | ICD-10-CM | POA: Insufficient documentation

## 2016-02-08 DIAGNOSIS — Z8719 Personal history of other diseases of the digestive system: Secondary | ICD-10-CM | POA: Insufficient documentation

## 2016-02-08 DIAGNOSIS — G8929 Other chronic pain: Secondary | ICD-10-CM | POA: Diagnosis not present

## 2016-02-08 DIAGNOSIS — E119 Type 2 diabetes mellitus without complications: Secondary | ICD-10-CM | POA: Insufficient documentation

## 2016-02-08 LAB — CBC WITH DIFFERENTIAL/PLATELET
Basophils Absolute: 0 10*3/uL (ref 0.0–0.1)
Basophils Relative: 1 %
EOS ABS: 0.1 10*3/uL (ref 0.0–0.7)
Eosinophils Relative: 2 %
HCT: 40.6 % (ref 36.0–46.0)
HEMOGLOBIN: 13.1 g/dL (ref 12.0–15.0)
LYMPHS ABS: 1.7 10*3/uL (ref 0.7–4.0)
Lymphocytes Relative: 27 %
MCH: 26.7 pg (ref 26.0–34.0)
MCHC: 32.3 g/dL (ref 30.0–36.0)
MCV: 82.7 fL (ref 78.0–100.0)
MONOS PCT: 9 %
Monocytes Absolute: 0.5 10*3/uL (ref 0.1–1.0)
NEUTROS PCT: 61 %
Neutro Abs: 3.8 10*3/uL (ref 1.7–7.7)
Platelets: 182 10*3/uL (ref 150–400)
RBC: 4.91 MIL/uL (ref 3.87–5.11)
RDW: 14.6 % (ref 11.5–15.5)
WBC: 6.1 10*3/uL (ref 4.0–10.5)

## 2016-02-08 LAB — URINALYSIS, ROUTINE W REFLEX MICROSCOPIC
Bilirubin Urine: NEGATIVE
Glucose, UA: NEGATIVE mg/dL
Hgb urine dipstick: NEGATIVE
Ketones, ur: NEGATIVE mg/dL
NITRITE: NEGATIVE
Protein, ur: NEGATIVE mg/dL
SPECIFIC GRAVITY, URINE: 1.019 (ref 1.005–1.030)
pH: 6 (ref 5.0–8.0)

## 2016-02-08 LAB — BASIC METABOLIC PANEL
ANION GAP: 11 (ref 5–15)
BUN: 21 mg/dL — AB (ref 6–20)
CO2: 24 mmol/L (ref 22–32)
Calcium: 9.5 mg/dL (ref 8.9–10.3)
Chloride: 105 mmol/L (ref 101–111)
Creatinine, Ser: 1 mg/dL (ref 0.44–1.00)
GFR calc non Af Amer: 58 mL/min — ABNORMAL LOW (ref >60)
Glucose, Bld: 124 mg/dL — ABNORMAL HIGH (ref 65–99)
Potassium: 4.4 mmol/L (ref 3.5–5.1)
SODIUM: 140 mmol/L (ref 135–145)

## 2016-02-08 LAB — URINE MICROSCOPIC-ADD ON: RBC / HPF: NONE SEEN RBC/hpf (ref 0–5)

## 2016-02-08 MED ORDER — MORPHINE SULFATE (PF) 4 MG/ML IV SOLN
4.0000 mg | Freq: Once | INTRAVENOUS | Status: AC
  Administered 2016-02-08: 4 mg via INTRAVENOUS
  Filled 2016-02-08: qty 1

## 2016-02-08 MED ORDER — OXYCODONE-ACETAMINOPHEN 5-325 MG PO TABS
1.0000 | ORAL_TABLET | ORAL | Status: DC | PRN
  Administered 2016-02-08: 1 via ORAL

## 2016-02-08 MED ORDER — OXYCODONE-ACETAMINOPHEN 5-325 MG PO TABS
ORAL_TABLET | ORAL | Status: AC
  Filled 2016-02-08: qty 1

## 2016-02-08 NOTE — ED Notes (Signed)
MD at bedside updating patient.

## 2016-02-08 NOTE — ED Notes (Signed)
Pt reports right flank pain radiating into her right groin. Pt alert x4. NAD at this time.

## 2016-02-08 NOTE — ED Notes (Signed)
Patient able todress and ambulate with cane (home baseline)

## 2016-02-08 NOTE — ED Provider Notes (Signed)
CSN: [id number]     Arrival date & time 02/08/16  1148 History   First MD Initiated Contact with Patient 02/08/16 1621     Chief Complaint  Patient presents with  . Flank Pain     (Consider location/radiation/quality/duration/timing/severity/associated sxs/prior Treatment) HPI  65 year old female presents with right back/flank/right lower quadrant pain. States the pain starts in her right lower back and radiates around to the front of her abdomen into her groin. Has been ongoing for the last couple days. Pain is constant but much worse whenever sitting up or standing up. Any type of movement makes the pain worse. Pain is still present at rest but were tolerable. Is on hydrocodone and Ultram for chronic back pain from spinal stenosis. This feels different than that. No radiation into her legs and no weakness or numbness. Denies hematuria or dysuria. Has never had symptoms like this before. Currently her pain is severe. Feels low nauseated after being given Percocet in the waiting room but otherwise has not had nausea or vomiting.  Past Medical History  Diagnosis Date  . Diabetes mellitus   . DVT (deep venous thrombosis) (HCC)     hx of 1998 post knee replacement  . Back pain     Back injections at [facility] for back pain &  spinal stenosis  . Barrett's esophagus   . Carpal tunnel syndrome   . History of nephrolithiasis   . Uterine fibroids affecting pregnancy   . Diverticulitis   . Hemorrhoids   . Hiatal hernia   . Osteoarthritis, knee   . Allergy    Past Surgical History  Procedure Laterality Date  . Total knee arthroplasty      right  . Laparoscopy     Family History  Problem Relation Age of Onset  . Cancer Sister   . Diabetes Son   . Heart disease Son     cardiomyopathy   Social History  Substance Use Topics  . Smoking status: Former Research scientist (life sciences)  . Smokeless tobacco: Never Used     Comment: quit 1975  . Alcohol Use: No   OB History    Gravida Para Term Preterm AB TAB SAB  Ectopic Multiple Living   6 3 3  3  3   3      Review of Systems  Constitutional: Negative for fever.  Gastrointestinal: Positive for abdominal pain. Negative for nausea, vomiting and diarrhea.  Genitourinary: Positive for flank pain. Negative for dysuria and hematuria.  Musculoskeletal: Positive for back pain.  All other systems reviewed and are negative.     Allergies  Sulfamethoxazole-trimethoprim and Tdap  Home Medications   Prior to Admission medications   Medication Sig Start Date End Date Taking? Authorizing Provider  HYDROcodone-acetaminophen (NORCO/VICODIN) 5-325 MG tablet Take 1 tablet by mouth 2 (two) times daily as needed for moderate pain. 10/19/15  Yes Venia Carbon, MD  traMADol (ULTRAM) 50 MG tablet Take 1 tablet (50 mg total) by mouth 3 (three) times daily as needed. 02/05/16   Venia Carbon, MD   BP 129/75 mmHg  Pulse 57  Temp(Src) 97.7 F (36.5 C) (Oral)  Resp 18  SpO2 100% Physical Exam  Constitutional: She is oriented to person, place, and time. She appears well-developed and well-nourished.  HENT:  Head: Normocephalic and atraumatic.  Right Ear: External ear normal.  Left Ear: External ear normal.  Nose: Nose normal.  Eyes: Right eye exhibits no discharge. Left eye exhibits no discharge.  Cardiovascular: Normal rate, regular rhythm and  normal heart sounds.   Pulmonary/Chest: Effort normal and breath sounds normal.  Abdominal: Soft. She exhibits no distension. There is no tenderness. There is no CVA tenderness.  Neurological: She is alert and oriented to person, place, and time.  5/5 strength in bilateral lower extremities. Normal gross sensation  Skin: Skin is warm and dry.  No rash or obvious HSV  Nursing note and vitals reviewed.   ED Course  Procedures (including critical care time) Labs Review Labs Reviewed  URINALYSIS, ROUTINE W REFLEX MICROSCOPIC (NOT AT [facility]) - Abnormal; Notable for the following:    Leukocytes, UA TRACE (*)    All  other components within normal limits  BASIC METABOLIC PANEL - Abnormal; Notable for the following:    Glucose, Bld 124 (*)    BUN 21 (*)    GFR calc non Af Amer 58 (*)    All other components within normal limits  URINE MICROSCOPIC-ADD ON - Abnormal; Notable for the following:    Squamous Epithelial / LPF 0-5 (*)    Bacteria, UA RARE (*)    All other components within normal limits  CBC WITH DIFFERENTIAL/PLATELET    Imaging Review Ct Renal Stone Study  02/08/2016  CLINICAL DATA:  Right lower quadrant and right flank pain for 1 week. Symptoms worsened last night. EXAM: CT ABDOMEN AND PELVIS WITHOUT CONTRAST TECHNIQUE: Multidetector CT imaging of the abdomen and pelvis was performed following the standard protocol without IV contrast. COMPARISON:  10/18/2005 and 10/12/2009 FINDINGS: Lower chest: The lung bases are clear of acute process. No pleural effusion or pulmonary lesions. The heart is normal in size. No pericardial effusion. The distal esophagus and aorta are unremarkable. Small hiatal hernia. Hepatobiliary: No focal hepatic lesions or intrahepatic biliary dilatation. The gallbladder is normal. No common bile duct dilatation. Pancreas: Slightly prominent pancreatic head but this is a stable finding. No mass, inflammation or ductal dilatation. Spleen: Normal size.  No focal lesions. Adrenals/Urinary Tract: The adrenal glands are unremarkable. No renal, ureteral or bladder calculi.  No renal or bladder mass. Stomach/Bowel: The stomach, duodenum, small bowel and colon are grossly normal without oral contrast. No inflammatory changes, mass lesions or obstructive findings. Moderate diverticulosis of the descending and sigmoid colon but no findings for acute diverticulitis. The terminal ileum is normal. The appendix is normal. Vascular/Lymphatic: No mesenteric or retroperitoneal mass or adenopathy. Moderate tortuosity and mild ectasia of the abdominal aorta. No significant atherosclerotic  calcifications. Scattered iliac artery calcifications. Reproductive: Small partially calcified uterine fibroids are suspected. The ovaries are grossly normal. Other: No pelvic mass or adenopathy. No free pelvic fluid collections. No inguinal mass or adenopathy. Musculoskeletal: Advanced degenerative disc disease at L4-5 and L5-S1 with marked endplate reactive changes. Degenerative retrolisthesis of L5 with a bulging uncovered discs. There is relatively severe spinal and bilateral lateral recess stenosis at L4-5 and to a lesser extent L5-S1. Lumbar spine MRI may be helpful for further evaluation. IMPRESSION: 1. No renal, ureteral or bladder calculi or mass. 2. No acute abdominal/pelvic findings, mass lesions or lymphadenopathy. 3. Small hiatal hernia. 4. Suspect small calcified uterine fibroids. 5. Severe degenerative disc disease and facet disease at L4-5 and L5-S1 with significant spinal and bilateral lateral recess stenosis and possible foraminal stenosis. MRI lumbar spine may be helpful for further evaluation. Electronically Signed   By: Marijo Sanes M.D.   On: 02/08/2016 17:50   I have personally reviewed and evaluated these images and lab results as part of my medical decision-making.   EKG  Interpretation None      MDM   Final diagnoses:  Right flank pain    Unclear why the patient is having right flank/back pain. No significant abdominal tenderness. No UTI or hematuria. No rash that would be consistent with HSV. She has chronic back pain but I do not think this is related to a spinal emergency. No weakness or numbness. Patient able to ambulate. She feels much better and has both tramadol and hydrocodone at home. Recommend follow-up with her PCP and likely outpatient spinal surgeon referral.    Sherwood Gambler, MD 02/09/16 [phone]

## 2016-02-09 ENCOUNTER — Telehealth: Payer: Self-pay

## 2016-02-09 NOTE — Telephone Encounter (Signed)
Spoke to patient. She is hurting a lot. Supposed to be seeing a spine specialist today.

## 2016-02-09 NOTE — Telephone Encounter (Signed)
Left message to call back to see how she was feeling after her ER visit yesterday

## 2016-02-14 DIAGNOSIS — M4316 Spondylolisthesis, lumbar region: Secondary | ICD-10-CM | POA: Diagnosis not present

## 2016-02-14 DIAGNOSIS — M47816 Spondylosis without myelopathy or radiculopathy, lumbar region: Secondary | ICD-10-CM | POA: Diagnosis not present

## 2016-02-14 DIAGNOSIS — G5603 Carpal tunnel syndrome, bilateral upper limbs: Secondary | ICD-10-CM | POA: Diagnosis not present

## 2016-02-14 DIAGNOSIS — M5416 Radiculopathy, lumbar region: Secondary | ICD-10-CM | POA: Diagnosis not present

## 2016-02-27 DIAGNOSIS — M4806 Spinal stenosis, lumbar region: Secondary | ICD-10-CM | POA: Diagnosis not present

## 2016-02-27 DIAGNOSIS — M5416 Radiculopathy, lumbar region: Secondary | ICD-10-CM | POA: Diagnosis not present

## 2016-02-27 DIAGNOSIS — M4316 Spondylolisthesis, lumbar region: Secondary | ICD-10-CM | POA: Diagnosis not present

## 2016-02-27 DIAGNOSIS — M5116 Intervertebral disc disorders with radiculopathy, lumbar region: Secondary | ICD-10-CM | POA: Diagnosis not present

## 2016-02-28 DIAGNOSIS — M4316 Spondylolisthesis, lumbar region: Secondary | ICD-10-CM | POA: Diagnosis not present

## 2016-02-28 DIAGNOSIS — M5416 Radiculopathy, lumbar region: Secondary | ICD-10-CM | POA: Diagnosis not present

## 2016-02-28 DIAGNOSIS — R262 Difficulty in walking, not elsewhere classified: Secondary | ICD-10-CM | POA: Diagnosis not present

## 2016-02-28 DIAGNOSIS — R531 Weakness: Secondary | ICD-10-CM | POA: Diagnosis not present

## 2016-02-29 ENCOUNTER — Encounter: Payer: Self-pay | Admitting: Internal Medicine

## 2016-02-29 ENCOUNTER — Ambulatory Visit (INDEPENDENT_AMBULATORY_CARE_PROVIDER_SITE_OTHER): Payer: Medicare Other | Admitting: Internal Medicine

## 2016-02-29 VITALS — BP 102/70 | HR 71 | Temp 98.0°F | Wt 227.0 lb

## 2016-02-29 DIAGNOSIS — M5416 Radiculopathy, lumbar region: Secondary | ICD-10-CM

## 2016-02-29 DIAGNOSIS — R3915 Urgency of urination: Secondary | ICD-10-CM | POA: Diagnosis not present

## 2016-02-29 LAB — POC URINALSYSI DIPSTICK (AUTOMATED)
Bilirubin, UA: NEGATIVE
Blood, UA: NEGATIVE
Glucose, UA: NEGATIVE
KETONES UA: NEGATIVE
Leukocytes, UA: NEGATIVE
Nitrite, UA: NEGATIVE
PROTEIN UA: NEGATIVE
SPEC GRAV UA: 1.025
Urobilinogen, UA: 4
pH, UA: 6

## 2016-02-29 MED ORDER — CIPROFLOXACIN HCL 250 MG PO TABS: 250.0000 mg | ORAL_TABLET | Freq: Two times a day (BID) | ORAL | Status: DC

## 2016-02-29 MED ORDER — HYDROCODONE-ACETAMINOPHEN 5-325 MG PO TABS: 0.5000 | ORAL_TABLET | Freq: Two times a day (BID) | ORAL | Status: DC | PRN

## 2016-02-29 NOTE — Assessment & Plan Note (Signed)
Unlikely infection but will give 3 days of antibiotic just in case

## 2016-02-29 NOTE — Progress Notes (Signed)
   Subjective:    Patient ID: Heather Shaffer, female    DOB: 1950-12-28, 65 y.o.   MRN: [id number]  HPI Here due to ongoing flank and urinary problems Still having back pain---- reviewed ER visit from 3 weeks ago CT negative for stone (or other sig abnormality)  Gets right back pain around to her groin (or RLQ) So severe she was screaming in pain Thought it was a stone but CT negative Finally went away after a couple of days  Still notes urinary urgency for the past month Causing some incontinence No dysuria or hematuria Urine is yellow---  But usually clear (is on vitamins and probiotic) Nocturia x 3 is fairly stable  Current Outpatient Prescriptions on File Prior to Visit  Medication Sig Dispense Refill  . HYDROcodone-acetaminophen (NORCO/VICODIN) 5-325 MG tablet Take 1 tablet by mouth 2 (two) times daily as needed for moderate pain. 30 tablet 0  . traMADol (ULTRAM) 50 MG tablet Take 1 tablet (50 mg total) by mouth 3 (three) times daily as needed. 90 tablet 0   No current facility-administered medications on file prior to visit.    Allergies  Allergen Reactions  . Sulfamethoxazole-Trimethoprim     REACTION: tongue swells  . Tdap [Diphth-Acell Pertussis-Tetanus] Rash    Past Medical History  Diagnosis Date  . Diabetes mellitus   . DVT (deep venous thrombosis) (HCC)     hx of 1998 post knee replacement  . Back pain     Back injections at [facility] for back pain &  spinal stenosis  . Barrett's esophagus   . Carpal tunnel syndrome   . History of nephrolithiasis   . Uterine fibroids affecting pregnancy   . Diverticulitis   . Hemorrhoids   . Hiatal hernia   . Osteoarthritis, knee   . Allergy     Past Surgical History  Procedure Laterality Date  . Total knee arthroplasty      right  . Laparoscopy      Family History  Problem Relation Age of Onset  . Cancer Sister   . Diabetes Son   . Heart disease Son     cardiomyopathy    Social History   Social History    . Marital Status: Legally Separated    Spouse Name: N/A  . Number of Children: 3  . Years of Education: N/A   Occupational History  . Realtor, Art therapist     retired  . [employer]     retired   Social History Main Topics  . Smoking status: Former Research scientist (life sciences)  . Smokeless tobacco: Never Used     Comment: quit 1975  . Alcohol Use: No  . Drug Use: No  . Sexual Activity: Not Currently    Birth Control/ Protection: Post-menopausal   Other Topics Concern  . Not on file   Social History Narrative   No living will   Daughter is health care POA Arsenio Loader)   Would accept resuscitation   Would probably accept tube feeds   Review of Systems No fever but feels cold at times No N/V Appetite is waning but she is eating Weight is up--just can't exercise No flank rash    Objective:   Physical Exam  Abdominal: Soft. She exhibits no distension. There is no tenderness. There is no rebound and no guarding.  Musculoskeletal:  No spine tenderness  Skin: No rash noted.          Assessment & Plan:

## 2016-02-29 NOTE — Progress Notes (Signed)
Pre visit review using our clinic review tool, if applicable. No additional management support is needed unless otherwise documented below in the visit note. 

## 2016-02-29 NOTE — Addendum Note (Signed)
Addended by: Pilar Grammes on: 02/29/2016 04:54 PM   Modules accepted: Orders

## 2016-02-29 NOTE — Assessment & Plan Note (Addendum)
Sounds like a radiculopathy in spinal nerve in low lumbar area Pain was severe for ER visit---and is better now but still persistent If pain is worse, would consider having her restart the gabapentin (stopped because she is able to sleep if in chair)

## 2016-03-01 DIAGNOSIS — R262 Difficulty in walking, not elsewhere classified: Secondary | ICD-10-CM | POA: Diagnosis not present

## 2016-03-01 DIAGNOSIS — M5416 Radiculopathy, lumbar region: Secondary | ICD-10-CM | POA: Diagnosis not present

## 2016-03-01 DIAGNOSIS — R531 Weakness: Secondary | ICD-10-CM | POA: Diagnosis not present

## 2016-03-01 DIAGNOSIS — M4316 Spondylolisthesis, lumbar region: Secondary | ICD-10-CM | POA: Diagnosis not present

## 2016-03-04 DIAGNOSIS — M5416 Radiculopathy, lumbar region: Secondary | ICD-10-CM | POA: Diagnosis not present

## 2016-03-04 DIAGNOSIS — R262 Difficulty in walking, not elsewhere classified: Secondary | ICD-10-CM | POA: Diagnosis not present

## 2016-03-04 DIAGNOSIS — M4316 Spondylolisthesis, lumbar region: Secondary | ICD-10-CM | POA: Diagnosis not present

## 2016-03-04 DIAGNOSIS — R531 Weakness: Secondary | ICD-10-CM | POA: Diagnosis not present

## 2016-03-06 DIAGNOSIS — M4316 Spondylolisthesis, lumbar region: Secondary | ICD-10-CM | POA: Diagnosis not present

## 2016-03-06 DIAGNOSIS — R531 Weakness: Secondary | ICD-10-CM | POA: Diagnosis not present

## 2016-03-06 DIAGNOSIS — R262 Difficulty in walking, not elsewhere classified: Secondary | ICD-10-CM | POA: Diagnosis not present

## 2016-03-06 DIAGNOSIS — M5416 Radiculopathy, lumbar region: Secondary | ICD-10-CM | POA: Diagnosis not present

## 2016-03-12 DIAGNOSIS — M4316 Spondylolisthesis, lumbar region: Secondary | ICD-10-CM | POA: Diagnosis not present

## 2016-03-12 DIAGNOSIS — R262 Difficulty in walking, not elsewhere classified: Secondary | ICD-10-CM | POA: Diagnosis not present

## 2016-03-12 DIAGNOSIS — R531 Weakness: Secondary | ICD-10-CM | POA: Diagnosis not present

## 2016-03-12 DIAGNOSIS — M5416 Radiculopathy, lumbar region: Secondary | ICD-10-CM | POA: Diagnosis not present

## 2016-03-22 DIAGNOSIS — N8110 Cystocele, unspecified: Secondary | ICD-10-CM | POA: Diagnosis not present

## 2016-03-26 DIAGNOSIS — M4807 Spinal stenosis, lumbosacral region: Secondary | ICD-10-CM | POA: Diagnosis not present

## 2016-03-26 DIAGNOSIS — M5137 Other intervertebral disc degeneration, lumbosacral region: Secondary | ICD-10-CM | POA: Diagnosis not present

## 2016-03-26 DIAGNOSIS — M545 Low back pain: Secondary | ICD-10-CM | POA: Diagnosis not present

## 2016-03-26 DIAGNOSIS — M4806 Spinal stenosis, lumbar region: Secondary | ICD-10-CM | POA: Diagnosis not present

## 2016-03-26 DIAGNOSIS — M4317 Spondylolisthesis, lumbosacral region: Secondary | ICD-10-CM | POA: Diagnosis not present

## 2016-03-26 DIAGNOSIS — R200 Anesthesia of skin: Secondary | ICD-10-CM | POA: Diagnosis not present

## 2016-03-26 DIAGNOSIS — R5383 Other fatigue: Secondary | ICD-10-CM | POA: Diagnosis not present

## 2016-04-04 DIAGNOSIS — N390 Urinary tract infection, site not specified: Secondary | ICD-10-CM | POA: Diagnosis not present

## 2016-04-04 DIAGNOSIS — N8189 Other female genital prolapse: Secondary | ICD-10-CM | POA: Diagnosis not present

## 2016-04-30 ENCOUNTER — Encounter: Payer: Self-pay | Admitting: Internal Medicine

## 2016-04-30 ENCOUNTER — Ambulatory Visit (INDEPENDENT_AMBULATORY_CARE_PROVIDER_SITE_OTHER): Payer: Medicare Other | Admitting: Internal Medicine

## 2016-04-30 VITALS — BP 136/88 | HR 60 | Temp 97.6°F | Wt 228.0 lb

## 2016-04-30 DIAGNOSIS — M5136 Other intervertebral disc degeneration, lumbar region: Secondary | ICD-10-CM | POA: Diagnosis not present

## 2016-04-30 DIAGNOSIS — E1149 Type 2 diabetes mellitus with other diabetic neurological complication: Secondary | ICD-10-CM | POA: Diagnosis not present

## 2016-04-30 LAB — HEMOGLOBIN A1C: HEMOGLOBIN A1C: 6.8 % — AB (ref 4.6–6.5)

## 2016-04-30 MED ORDER — METFORMIN HCL ER 500 MG PO TB24: 500.0000 mg | ORAL_TABLET | Freq: Every day | ORAL | Status: DC

## 2016-04-30 NOTE — Assessment & Plan Note (Signed)
Very limited due to this Has rollator--but doesn't use it all the time Prn hydrocodone

## 2016-04-30 NOTE — Progress Notes (Signed)
Pre visit review using our clinic review tool, if applicable. No additional management support is needed unless otherwise documented below in the visit note. 

## 2016-04-30 NOTE — Assessment & Plan Note (Signed)
Seems to still have good control but discussed starting metformin Will start Uses gabapentin at times for her pain

## 2016-04-30 NOTE — Progress Notes (Signed)
Subjective:    Patient ID: Heather Shaffer, female    DOB: 1951-05-15, 65 y.o.   MRN: [id number]  HPI Here for follow up of diabetes and other chronic health conditions  Had 1 episode of severe flank pain since last visit Didn't last long  Checks sugars rarely 179 today though-- has eaten more lately (family reunion/ice cream party at church) Usually close to 100 Gets lethargic if she eats any sugar (on no meds) No major foot pain  Current Outpatient Prescriptions on File Prior to Visit  Medication Sig Dispense Refill  . HYDROcodone-acetaminophen (NORCO/VICODIN) 5-325 MG tablet Take 0.5-1 tablets by mouth 2 (two) times daily as needed for moderate pain. 30 tablet 0  . traMADol (ULTRAM) 50 MG tablet Take 1 tablet (50 mg total) by mouth 3 (three) times daily as needed. 90 tablet 0   No current facility-administered medications on file prior to visit.    Allergies  Allergen Reactions  . Sulfamethoxazole-Trimethoprim     REACTION: tongue swells  . Latex Rash  . Tdap [Diphth-Acell Pertussis-Tetanus] Rash    Past Medical History  Diagnosis Date  . Diabetes mellitus   . DVT (deep venous thrombosis) (HCC)     hx of 1998 post knee replacement  . Back pain     Back injections at [facility] for back pain &  spinal stenosis  . Barrett's esophagus   . Carpal tunnel syndrome   . History of nephrolithiasis   . Uterine fibroids affecting pregnancy   . Diverticulitis   . Hemorrhoids   . Hiatal hernia   . Osteoarthritis, knee   . Allergy     Past Surgical History  Procedure Laterality Date  . Total knee arthroplasty      right  . Laparoscopy      Family History  Problem Relation Age of Onset  . Cancer Sister   . Diabetes Son   . Heart disease Son     cardiomyopathy    Social History   Social History  . Marital Status: Legally Separated    Spouse Name: N/A  . Number of Children: 3  . Years of Education: N/A   Occupational History  . Realtor, Counselling psychologist     retired  . [employer]     retired   Social History Main Topics  . Smoking status: Former Research scientist (life sciences)  . Smokeless tobacco: Never Used     Comment: quit 1975  . Alcohol Use: No  . Drug Use: No  . Sexual Activity: Not Currently    Birth Control/ Protection: Post-menopausal   Other Topics Concern  . Not on file   Social History Narrative   No living will   Daughter is health care POA Arsenio Loader)   Would accept resuscitation   Would probably accept tube feeds   Review of Systems  Appetite is okay Weight is stable Seems to retain fluid at times Sleeps okay with the gabapentin (but doesn't take it every night---makes her sleep 12 hours)     Objective:   Physical Exam  Constitutional: She appears well-developed and well-nourished. No distress.  Neck: Normal range of motion. Neck supple.  Cardiovascular: Normal rate, regular rhythm, normal heart sounds and intact distal pulses.  Exam reveals no gallop.   No murmur heard. Pulmonary/Chest: Effort normal and breath sounds normal. No respiratory distress. She has no wheezes. She has no rales.  Musculoskeletal: She exhibits no edema.  Right foot arch is fallen  Lymphadenopathy:    She  has no cervical adenopathy.  Skin:  No foot lesions  Psychiatric: She has a normal mood and affect. Her behavior is normal.          Assessment & Plan:

## 2016-05-01 LAB — MICROALBUMIN / CREATININE URINE RATIO
CREATININE, U: 102.5 mg/dL
Microalb Creat Ratio: 0.7 mg/g (ref 0.0–30.0)

## 2016-05-23 ENCOUNTER — Other Ambulatory Visit: Payer: Self-pay

## 2016-05-23 MED ORDER — METFORMIN HCL ER 500 MG PO TB24: 500.0000 mg | ORAL_TABLET | Freq: Every day | ORAL | Status: DC

## 2016-05-23 MED ORDER — GABAPENTIN 300 MG PO CAPS: 300.0000 mg | ORAL_CAPSULE | Freq: Every day | ORAL | Status: DC

## 2016-05-23 MED ORDER — TRAMADOL HCL 50 MG PO TABS: 50.0000 mg | ORAL_TABLET | Freq: Three times a day (TID) | ORAL | Status: DC | PRN

## 2016-05-23 NOTE — Telephone Encounter (Signed)
Sorry--tramadol #90 x 0

## 2016-05-23 NOTE — Telephone Encounter (Signed)
Approved:okay to send all for a year

## 2016-05-23 NOTE — Telephone Encounter (Signed)
Fax request received from [facility] for Metformin, Gabapentin, and Tramadol. Tramadol last filled 02-05-16 #90 Metformin last filled at local pharmacy 04-30-16 #30/11 Gabapentin is a historical rx

## 2016-05-23 NOTE — Telephone Encounter (Signed)
Tramadol, too?

## 2016-05-23 NOTE — Telephone Encounter (Signed)
Gabapentin and Metformin sent electronically. Tramadol faxed from external fax machine.

## 2016-07-13 IMAGING — CT CT RENAL STONE PROTOCOL
2 of 4 series · 15 of 46 positions shown, 17 images · non-contrast
Comparison: 10/18/2005 and 10/12/2009

CLINICAL DATA: Right lower quadrant and right flank pain for 1
week. Symptoms worsened last night.

EXAM:
CT ABDOMEN AND PELVIS WITHOUT CONTRAST
TECHNIQUE: Multidetector CT imaging of the abdomen and pelvis was performed
following the standard protocol without IV contrast.

[Series 2: renal stone 5mm · axial · 0.91mm/px · z∈[+1106,+1491]mm · 12 of 85 slices shown, 14 images]
[im 4/85  soft-tissue]
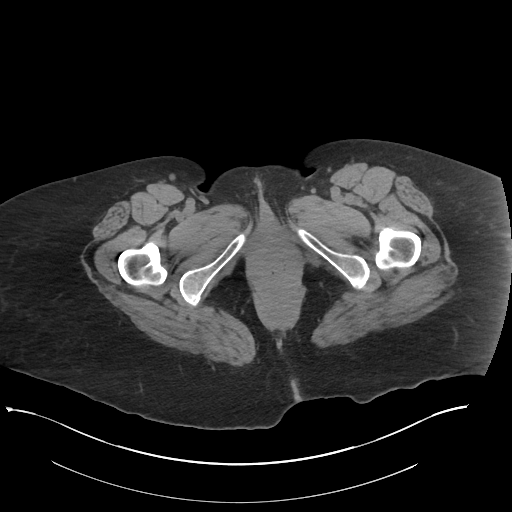
[im 4/85  bone]
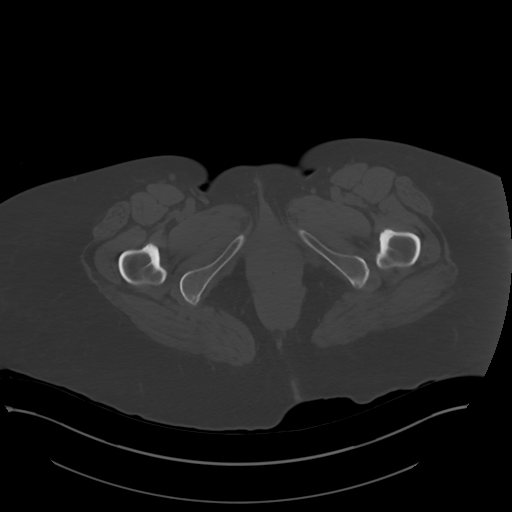
[im 11/85  soft-tissue]
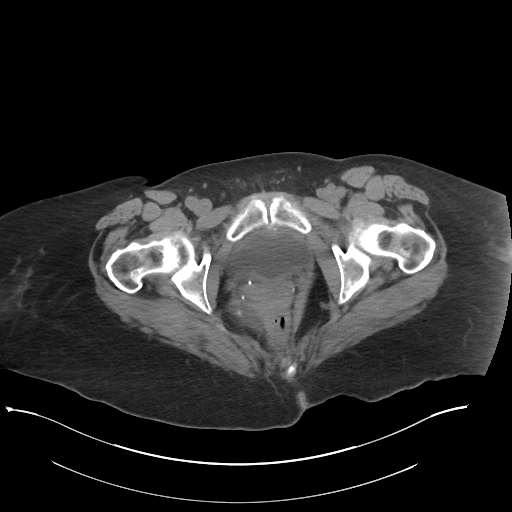
[im 18/85  soft-tissue]
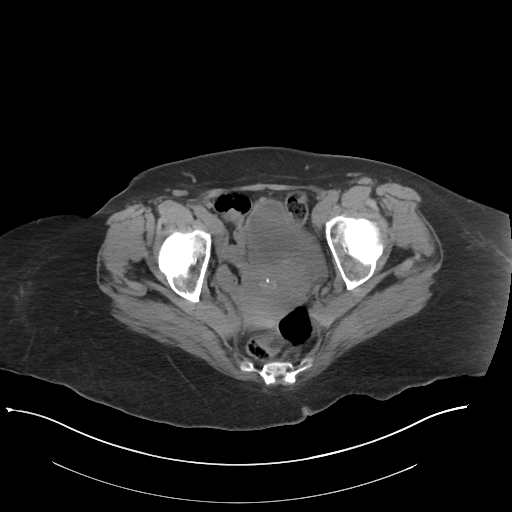
[im 25/85  soft-tissue]
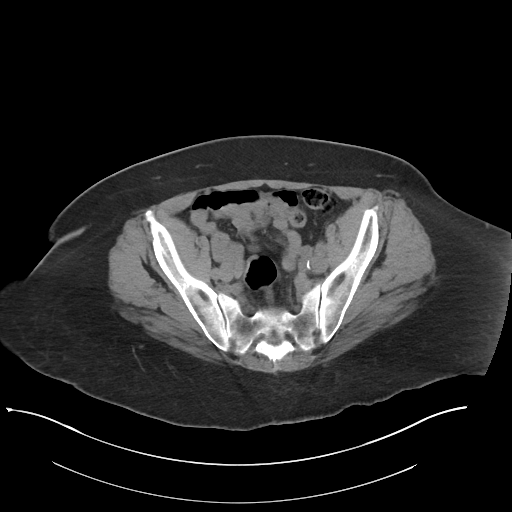
[im 32/85  soft-tissue]
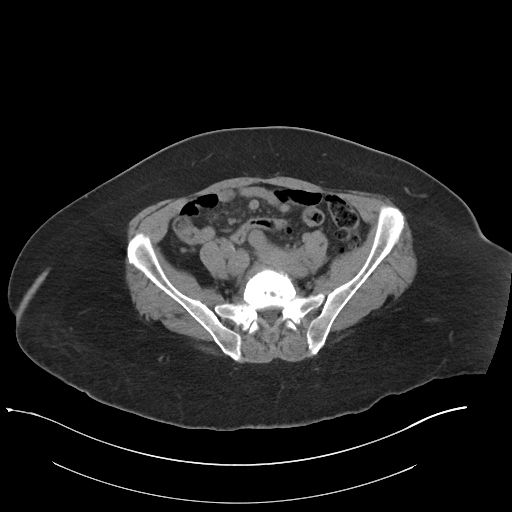
[im 39/85  soft-tissue]
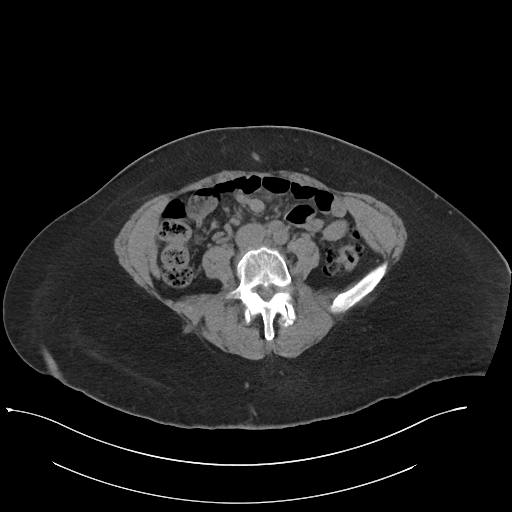
[im 46/85  soft-tissue]
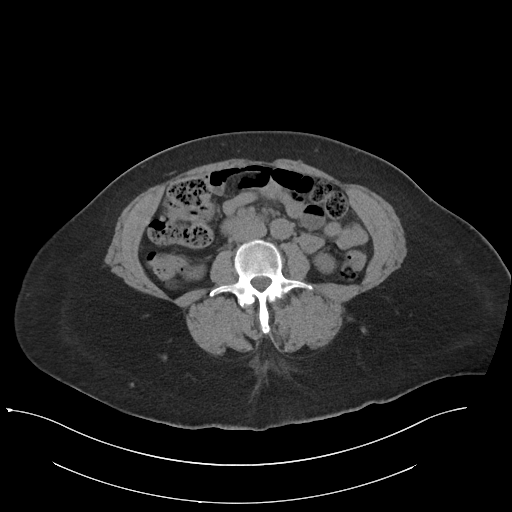
[im 53/85  soft-tissue]
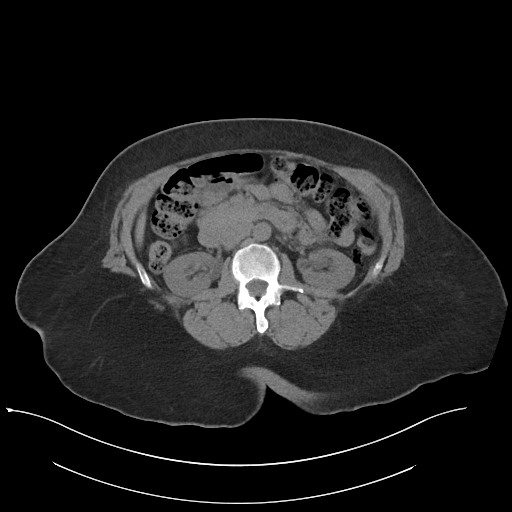
[im 60/85  soft-tissue]
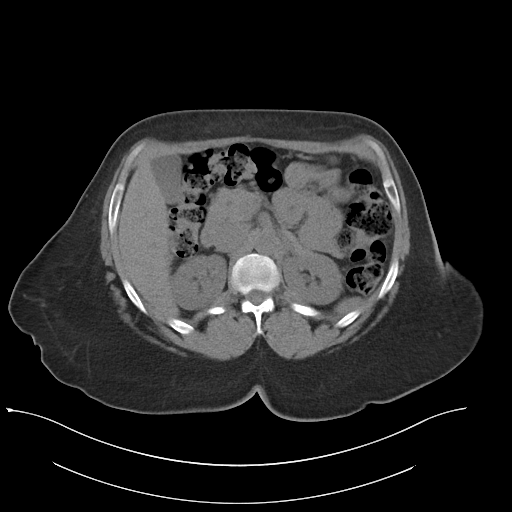
[im 60/85  bone]
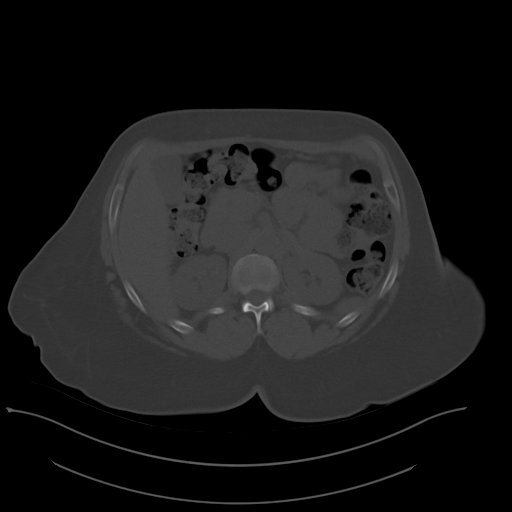
[im 67/85  soft-tissue]
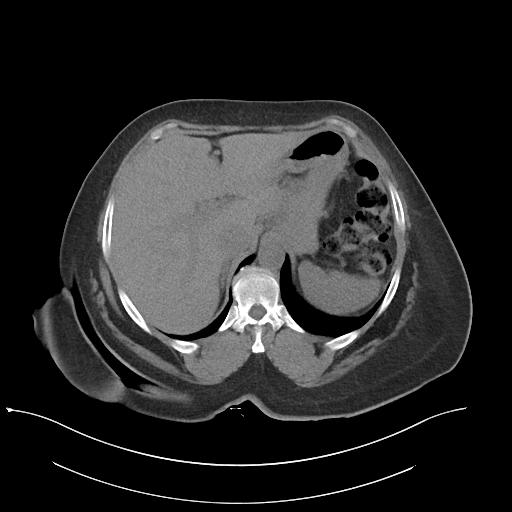
[im 74/85  soft-tissue]
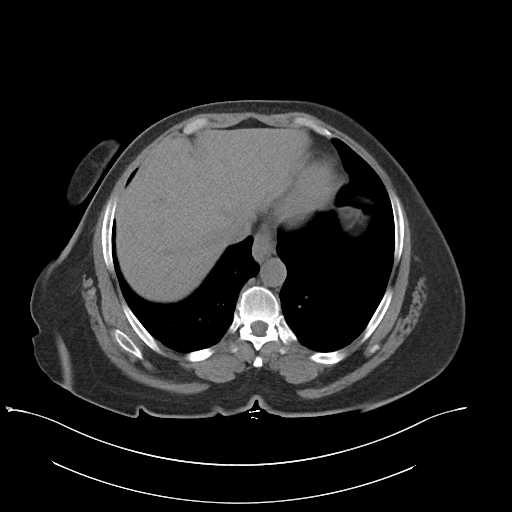
[im 81/85  soft-tissue]
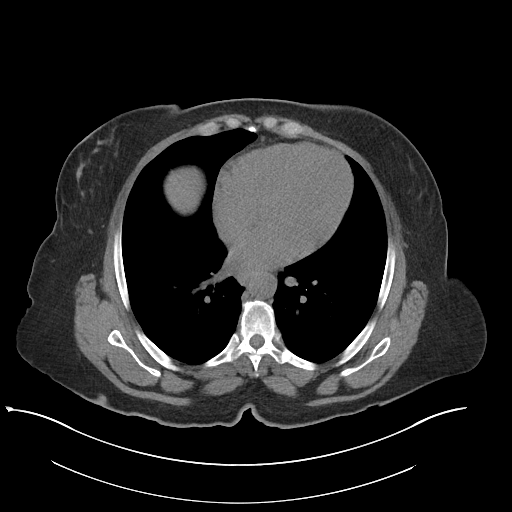

[Series 4: renal stone 3.0 cor · coronal · 0.71mm/px · 3 of 86 slices shown]
[im 29/86  soft-tissue]
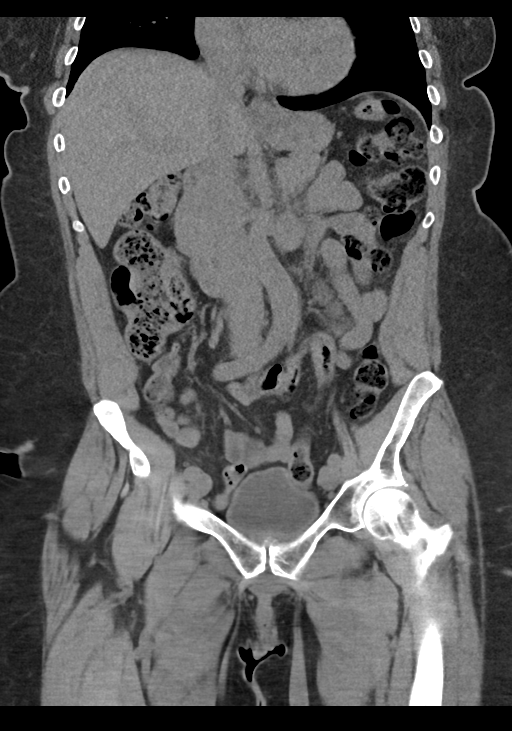
[im 38/86  soft-tissue]
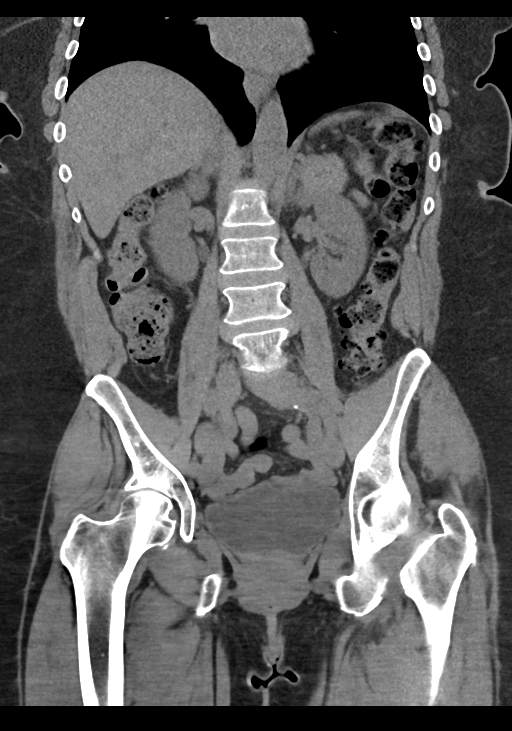
[im 48/86  soft-tissue]
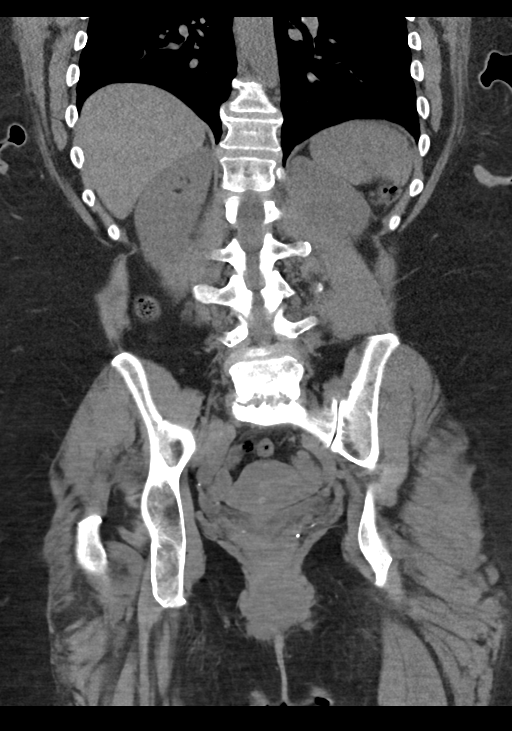

[15 of 46 positions shown; findings below may reference images not displayed]

FINDINGS: Lower chest: The lung bases are clear of acute process. No pleural
effusion or pulmonary lesions. The heart is normal in size. No
pericardial effusion. The distal esophagus and aorta are
unremarkable. Small hiatal hernia.

Hepatobiliary: No focal hepatic lesions or intrahepatic biliary
dilatation. The gallbladder is normal. No common bile duct
dilatation.

Pancreas: Slightly prominent pancreatic head but this is a stable
finding. No mass, inflammation or ductal dilatation.

Spleen: Normal size.  No focal lesions.

Adrenals/Urinary Tract: The adrenal glands are unremarkable.

No renal, ureteral or bladder calculi.  No renal or bladder mass.

Stomach/Bowel: The stomach, duodenum, small bowel and colon are
grossly normal without oral contrast. No inflammatory changes, mass
lesions or obstructive findings. Moderate diverticulosis of the
descending and sigmoid colon but no findings for acute
diverticulitis. The terminal ileum is normal. The appendix is
normal.

Vascular/Lymphatic: No mesenteric or retroperitoneal mass or
adenopathy. Moderate tortuosity and mild ectasia of the abdominal
aorta. No significant atherosclerotic calcifications. Scattered
iliac artery calcifications.

Reproductive: Small partially calcified uterine fibroids are
suspected. The ovaries are grossly normal.

Other: No pelvic mass or adenopathy. No free pelvic fluid
collections. No inguinal mass or adenopathy.

Musculoskeletal: Advanced degenerative disc disease at L4-5 and
L5-S1 with marked endplate reactive changes. Degenerative
retrolisthesis of L5 with a bulging uncovered discs. There is
relatively severe spinal and bilateral lateral recess stenosis at
L4-5 and to a lesser extent L5-S1. Lumbar spine MRI may be helpful
for further evaluation.
IMPRESSION: 1. No renal, ureteral or bladder calculi or mass.
2. No acute abdominal/pelvic findings, mass lesions or
lymphadenopathy.
3. Small hiatal hernia.
4. Suspect small calcified uterine fibroids.
5. Severe degenerative disc disease and facet disease at L4-5 and
L5-S1 with significant spinal and bilateral lateral recess stenosis
and possible foraminal stenosis. MRI lumbar spine may be helpful for
further evaluation.

## 2016-08-13 DIAGNOSIS — H524 Presbyopia: Secondary | ICD-10-CM | POA: Diagnosis not present

## 2016-08-13 DIAGNOSIS — E119 Type 2 diabetes mellitus without complications: Secondary | ICD-10-CM | POA: Diagnosis not present

## 2016-08-13 LAB — HM DIABETES EYE EXAM

## 2016-08-20 ENCOUNTER — Encounter: Payer: Self-pay | Admitting: Internal Medicine

## 2016-10-25 DIAGNOSIS — Z1231 Encounter for screening mammogram for malignant neoplasm of breast: Secondary | ICD-10-CM | POA: Diagnosis not present

## 2016-11-08 ENCOUNTER — Encounter: Payer: Medicare Other | Admitting: Internal Medicine

## 2016-11-18 ENCOUNTER — Encounter: Payer: Self-pay | Admitting: Internal Medicine

## 2016-11-18 ENCOUNTER — Ambulatory Visit (INDEPENDENT_AMBULATORY_CARE_PROVIDER_SITE_OTHER): Payer: Medicare Other | Admitting: Internal Medicine

## 2016-11-18 VITALS — BP 132/78 | HR 66 | Temp 98.0°F | Ht 63.0 in | Wt 237.0 lb

## 2016-11-18 DIAGNOSIS — Z7189 Other specified counseling: Secondary | ICD-10-CM

## 2016-11-18 DIAGNOSIS — Z1211 Encounter for screening for malignant neoplasm of colon: Secondary | ICD-10-CM

## 2016-11-18 DIAGNOSIS — H9319 Tinnitus, unspecified ear: Secondary | ICD-10-CM | POA: Diagnosis not present

## 2016-11-18 DIAGNOSIS — M5416 Radiculopathy, lumbar region: Secondary | ICD-10-CM | POA: Diagnosis not present

## 2016-11-18 DIAGNOSIS — E1149 Type 2 diabetes mellitus with other diabetic neurological complication: Secondary | ICD-10-CM | POA: Diagnosis not present

## 2016-11-18 DIAGNOSIS — Z0000 Encounter for general adult medical examination without abnormal findings: Secondary | ICD-10-CM | POA: Diagnosis not present

## 2016-11-18 LAB — HM DIABETES FOOT EXAM

## 2016-11-18 MED ORDER — HYDROCODONE-ACETAMINOPHEN 5-325 MG PO TABS: 0.5000 | ORAL_TABLET | Freq: Two times a day (BID) | ORAL | 0 refills | Status: DC | PRN

## 2016-11-18 NOTE — Assessment & Plan Note (Signed)
No hearing loss or sig vertigo Will refer to ENT if she worsens

## 2016-11-18 NOTE — Assessment & Plan Note (Signed)
I have personally reviewed the Medicare Annual Wellness questionnaire and have noted 1. The patient's medical and social history 2. Their use of alcohol, tobacco or illicit drugs 3. Their current medications and supplements 4. The patient's functional ability including ADL's, fall risks, home safety risks and hearing or visual             impairment. 5. Diet and physical activities 6. Evidence for depression or mood disorders  The patients weight, height, BMI and visual acuity have been recorded in the chart I have made referrals, counseling and provided education to the patient based review of the above and I have provided the pt with a written personalized care plan for preventive services.  I have provided you with a copy of your personalized plan for preventive services. Please take the time to review along with your updated medication list.  Prefers no immunizations Recent mammogram No pap due to age--but will go to gyn [facility] yearly Discussed trying to do some exercise

## 2016-11-18 NOTE — Progress Notes (Signed)
Pre visit review using our clinic review tool, if applicable. No additional management support is needed unless otherwise documented below in the visit note. 

## 2016-11-18 NOTE — Assessment & Plan Note (Signed)
Now thinks she wants her son as health care POA---gave her blank forms

## 2016-11-18 NOTE — Assessment & Plan Note (Addendum)
Better after ESI On the gabapentin rx for hydrocodone to have---- CSRS only tramadol from July (from me)

## 2016-11-18 NOTE — Progress Notes (Signed)
Subjective:    Patient ID: Heather Shaffer, female    DOB: 1951-04-08, 66 y.o.   MRN: [id number]  HPI Here for Medicare wellness visit and follow up of chronic health conditions Reviewed form and advanced directives Reviewed other doctors No tobacco or alcohol Not really able to exercise---discussed trying anything (considering water exercise)  Having ringing in her ears More annoying over the past 6 months High pitched sound--?both ears Hearing remains good Occasional vertigo if she bends down and stands up quickly--just a few seconds. Discussed ENT if progressive Vision is okay Hearing okay Has fallen several times--usually when out in yard working. Minor injuries Independent with instrumental ADLs Has noticed some problems with memory--- still independent with finances, etc  Ongoing back pain though this is much better Went to [facility] doctor--got ESI Now doing much better--walking better  Checks sugars occasionally/rarely Has been over 200 at times--but probably due to pomegranate juice Never kept up with metformin-- gave her GI problems Keeps up with eye doctor Ongoing numbness in feet---but pain is better Takes the gabapentin just prn--like after raking leaves (and at bedtime)  No heartburn or dysphagia Not getting regular EGD due to no symptoms (she states it is stress related) Still just legally separated from husband--- over 5 years. She prefers no divorce (for religious reasons)  Current Outpatient Prescriptions on File Prior to Visit  Medication Sig Dispense Refill  . gabapentin (NEURONTIN) 300 MG capsule Take 1 capsule (300 mg total) by mouth at bedtime. Take 1-2 capsules at bedtime 180 capsule 3  . HYDROcodone-acetaminophen (NORCO/VICODIN) 5-325 MG tablet Take 0.5-1 tablets by mouth 2 (two) times daily as needed for moderate pain. 30 tablet 0  . metFORMIN (GLUCOPHAGE-XR) 500 MG 24 hr tablet Take 1 tablet (500 mg total) by mouth daily with breakfast. 90 tablet 3   . traMADol (ULTRAM) 50 MG tablet Take 1 tablet (50 mg total) by mouth 3 (three) times daily as needed. 90 tablet 0   No current facility-administered medications on file prior to visit.     Allergies  Allergen Reactions  . Sulfamethoxazole-Trimethoprim     REACTION: tongue swells  . Latex Rash  . Tdap [Diphth-Acell Pertussis-Tetanus] Rash    Past Medical History:  Diagnosis Date  . Allergy   . Back pain    Back injections at [facility] for back pain &  spinal stenosis  . Barrett's esophagus   . Carpal tunnel syndrome   . Diabetes mellitus   . Diverticulitis   . DVT (deep venous thrombosis) (HCC)    hx of 1998 post knee replacement  . Hemorrhoids   . Hiatal hernia   . History of nephrolithiasis   . Osteoarthritis, knee   . Uterine fibroids affecting pregnancy     Past Surgical History:  Procedure Laterality Date  . LAPAROSCOPY    . TOTAL KNEE ARTHROPLASTY     right    Family History  Problem Relation Age of Onset  . Cancer Sister   . Diabetes Son   . Heart disease Son     cardiomyopathy    Social History   Social History  . Marital status: Legally Separated    Spouse name: N/A  . Number of children: 3  . Years of education: N/A   Occupational History  . Realtor, Art therapist     retired  . [employer]     retired   Social History Main Topics  . Smoking status: Former Research scientist (life sciences)  . Smokeless tobacco: Never  Used     Comment: quit 1975  . Alcohol use No  . Drug use: No  . Sexual activity: Not Currently    Birth control/ protection: Post-menopausal   Other Topics Concern  . Not on file   Social History Narrative   No living will   Daughter is health care POA Arsenio Loader)   Would accept resuscitation   Would probably accept tube feeds   Review of Systems Appetite is okay Weight up quite a bit-- is going to be more careful Sleep okay with the gabapentin--tinnitus has affected her (discussed sound machine) Wears seat belt Teeth are in bad  condition--several pulled and more to be done. Will need implants (getting someone new in [facility] for this) Has aches and pains in joints --especially after yard work Bowels are fine. No blood in stool.  Still sees gyn---mammo 12/22 [facility] gyn) No suspicious lesions in skin. Will get rash around neck from necklace if she perspires    Objective:   Physical Exam  Constitutional: She is oriented to person, place, and time. She appears well-developed and well-nourished. No distress.  HENT:  Mouth/Throat: Oropharynx is clear and moist. No oropharyngeal exudate.  Neck: Normal range of motion. Neck supple. No thyromegaly present.  Pulmonary/Chest: Effort normal and breath sounds normal. No respiratory distress. She has no wheezes. She has no rales.  Abdominal: Soft. There is no tenderness.  Musculoskeletal: She exhibits no edema.  Right foot is flat with bony prominence in arch (no change)  Lymphadenopathy:    She has no cervical adenopathy.  Neurological: She is alert and oriented to person, place, and time.  President--- "Daisy Floro, Obama, [place]----?" [phone] [id number] Recall 3/3  Decreased sensation in feet  Skin:  No foot lesions  Psychiatric: She has a normal mood and affect. Her behavior is normal.          Assessment & Plan:

## 2016-11-18 NOTE — Assessment & Plan Note (Signed)
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

## 2016-11-19 LAB — COMPREHENSIVE METABOLIC PANEL
ALT: 16 U/L (ref 0–35)
AST: 20 U/L (ref 0–37)
Albumin: 4.1 g/dL (ref 3.5–5.2)
Alkaline Phosphatase: 85 U/L (ref 39–117)
BUN: 17 mg/dL (ref 6–23)
CHLORIDE: 103 meq/L (ref 96–112)
CO2: 29 meq/L (ref 19–32)
CREATININE: 1.01 mg/dL (ref 0.40–1.20)
Calcium: 9.6 mg/dL (ref 8.4–10.5)
GFR: 70.49 mL/min (ref >60.00)
GLUCOSE: 133 mg/dL — AB (ref 70–99)
Potassium: 4.3 mEq/L (ref 3.5–5.1)
Sodium: 139 mEq/L (ref 135–145)
Total Bilirubin: 0.4 mg/dL (ref 0.2–1.2)
Total Protein: 7.6 g/dL (ref 6.0–8.3)

## 2016-11-19 LAB — CBC WITH DIFFERENTIAL/PLATELET
BASOS ABS: 0.1 10*3/uL (ref 0.0–0.1)
BASOS PCT: 1.4 % (ref 0.0–3.0)
Eosinophils Absolute: 0.2 10*3/uL (ref 0.0–0.7)
Eosinophils Relative: 2.9 % (ref 0.0–5.0)
HEMATOCRIT: 41 % (ref 36.0–46.0)
Hemoglobin: 13.4 g/dL (ref 12.0–15.0)
LYMPHS ABS: 2.3 10*3/uL (ref 0.7–4.0)
Lymphocytes Relative: 34 % (ref 12.0–46.0)
MCHC: 32.8 g/dL (ref 30.0–36.0)
MCV: 84.1 fl (ref 78.0–100.0)
MONOS PCT: 7.2 % (ref 3.0–12.0)
Monocytes Absolute: 0.5 10*3/uL (ref 0.1–1.0)
NEUTROS ABS: 3.7 10*3/uL (ref 1.4–7.7)
NEUTROS PCT: 54.5 % (ref 43.0–77.0)
PLATELETS: 234 10*3/uL (ref 150.0–400.0)
RBC: 4.88 Mil/uL (ref 3.87–5.11)
RDW: 14.7 % (ref 11.5–15.5)
WBC: 6.8 10*3/uL (ref 4.0–10.5)

## 2016-11-19 LAB — MICROALBUMIN / CREATININE URINE RATIO
Creatinine,U: 113.8 mg/dL
Microalb Creat Ratio: 0.6 mg/g (ref 0.0–30.0)

## 2016-11-19 LAB — LIPID PANEL
CHOL/HDL RATIO: 3
Cholesterol: 201 mg/dL — ABNORMAL HIGH (ref 0–200)
HDL: 67.2 mg/dL (ref >39.00)
LDL Cholesterol: 123 mg/dL — ABNORMAL HIGH (ref 0–99)
NONHDL: 134.21
TRIGLYCERIDES: 54 mg/dL (ref 0.0–149.0)
VLDL: 10.8 mg/dL (ref 0.0–40.0)

## 2016-11-19 LAB — T4, FREE: Free T4: 0.88 ng/dL (ref 0.60–1.60)

## 2016-11-19 LAB — HEMOGLOBIN A1C: HEMOGLOBIN A1C: 7.8 % — AB (ref 4.6–6.5)

## 2016-11-22 ENCOUNTER — Telehealth: Payer: Self-pay

## 2016-11-22 MED ORDER — GLIPIZIDE ER 5 MG PO TB24: 5.0000 mg | ORAL_TABLET | Freq: Every day | ORAL | 3 refills | Status: DC

## 2016-11-22 NOTE — Telephone Encounter (Signed)
Per Dr Silvio Pate: I would like her to start glipizide er 5mg  daily with breakfast. Have her let me know if there are any problems (like low sugar reactions)   Spoke to pt. She will let us know if she has any issues

## 2016-11-22 NOTE — Telephone Encounter (Signed)
rx sent to [facility] 

## 2016-12-02 ENCOUNTER — Telehealth: Payer: Self-pay | Admitting: Internal Medicine

## 2016-12-02 ENCOUNTER — Ambulatory Visit (INDEPENDENT_AMBULATORY_CARE_PROVIDER_SITE_OTHER): Payer: Medicare Other | Admitting: Emergency Medicine

## 2016-12-02 ENCOUNTER — Telehealth: Payer: Self-pay

## 2016-12-02 VITALS — BP 124/84 | HR 115 | Temp 99.6°F | Resp 16 | Ht 63.0 in | Wt 234.0 lb

## 2016-12-02 DIAGNOSIS — R509 Fever, unspecified: Secondary | ICD-10-CM | POA: Diagnosis not present

## 2016-12-02 DIAGNOSIS — J111 Influenza due to unidentified influenza virus with other respiratory manifestations: Secondary | ICD-10-CM

## 2016-12-02 DIAGNOSIS — R05 Cough: Secondary | ICD-10-CM

## 2016-12-02 DIAGNOSIS — R059 Cough, unspecified: Secondary | ICD-10-CM

## 2016-12-02 LAB — POCT INFLUENZA A/B
INFLUENZA A, POC: NEGATIVE
Influenza B, POC: POSITIVE — AB

## 2016-12-02 MED ORDER — PROMETHAZINE-CODEINE 6.25-10 MG/5ML PO SYRP: 5.0000 mL | ORAL_SOLUTION | Freq: Every evening | ORAL | 0 refills | Status: DC | PRN

## 2016-12-02 MED ORDER — OSELTAMIVIR PHOSPHATE 75 MG PO CAPS: 75.0000 mg | ORAL_CAPSULE | Freq: Two times a day (BID) | ORAL | 0 refills | Status: AC

## 2016-12-02 NOTE — Telephone Encounter (Signed)
Spoke to Leslie at [facility] and authorized refills

## 2016-12-02 NOTE — Telephone Encounter (Signed)
Patient Name: Heather Shaffer  DOB: Jun 06, 1951    Initial Comment Caller states has temp of 103.1, thinks she has the flu   Nurse Assessment  Nurse: Raphael Gibney, RN, Vanita Ingles Date/Time (Eastern Time): 12/02/2016 9:58:03 AM  Confirm and document reason for call. If symptomatic, describe symptoms. ---Caller states she has temp of 103.1. Has body aches. She started coughing on Friday. Fever started today. Has nasal congestion.  Does the patient have any new or worsening symptoms? ---Yes  Will a triage be completed? ---Yes  Related visit to physician within the last 2 weeks? ---No  Does the PT have any chronic conditions? (i.e. diabetes, asthma, etc.) ---Yes  List chronic conditions. ---diabetes  Is this a behavioral health or substance abuse call? ---No     Guidelines    Guideline Title Affirmed Question Affirmed Notes  Influenza - Seasonal [1] Fever > 100.5 F (38.1 C) AND [2] diabetes mellitus or weak immune system (e.g., HIV positive, cancer chemo, splenectomy, organ transplant, chronic steroids)    Final Disposition User   See Physician within 4 Hours (or PCP triage) Raphael Gibney, RN, Vera    Comments  No appts available at [facility]; pt states she will go to the urgent [facility] on [address]   Referrals  [facility] [place]   Disagree/Comply: Comply

## 2016-12-02 NOTE — Telephone Encounter (Signed)
Lennette Bihari at [facility] left v/m; pt brought in rx for Norco written on 11/18/16 and another rx for promethazine with codeine from another doctor. Lennette Bihari request cb to verify it is OK to fill Norco.Please advise.

## 2016-12-02 NOTE — Telephone Encounter (Signed)
Pt saw seen elsewhere.

## 2016-12-02 NOTE — Telephone Encounter (Signed)
Check on her Would consider empiric tamiflu--but it is better if she is seen

## 2016-12-02 NOTE — Telephone Encounter (Signed)
Yes Not sure why she didn't fill norco yet---I knew about the cough syrup (seen with the flu)

## 2016-12-02 NOTE — Telephone Encounter (Signed)
Left message to call office

## 2016-12-02 NOTE — Telephone Encounter (Signed)
Pt called again and said she is sitting at the pharmacy at [facility] and would like someone to contact them to refill her medication.  She did not leave any additional info.

## 2016-12-02 NOTE — Patient Instructions (Addendum)
     IF you received an x-ray today, you will receive an invoice from [facility]. Please contact [facility] at [phone] with questions or concerns regarding your invoice.   IF you received labwork today, you will receive an invoice from [facility]. Please contact [facility] at [phone] with questions or concerns regarding your invoice.   Our billing staff will not be able to assist you with questions regarding bills from these companies.  You will be contacted with the lab results as soon as they are available. The fastest way to get your results is to activate your My Chart account. Instructions are located on the last page of this paperwork. If you have not heard from us regarding the results in 2 weeks, please contact this office.      Influenza, Adult Influenza ("the flu") is an infection in the lungs, nose, and throat (respiratory tract). It is caused by a virus. The flu causes many common cold symptoms, as well as a high fever and body aches. It can make you feel very sick. The flu spreads easily from person to person (is contagious). Getting a flu shot (influenza vaccination) every year is the best way to prevent the flu. Follow these instructions at home:  Take over-the-counter and prescription medicines only as told by your doctor.  Use a cool mist humidifier to add moisture (humidity) to the air in your home. This can make it easier to breathe.  Rest as needed.  Drink enough fluid to keep your pee (urine) clear or pale yellow.  Cover your mouth and nose when you cough or sneeze.  Wash your hands with soap and water often, especially after you cough or sneeze. If you cannot use soap and water, use hand sanitizer.  Stay home from work or school as told by your doctor. Unless you are visiting your doctor, try to avoid leaving home until your fever has been gone for 24 hours without the use of medicine.  Keep all follow-up visits as told by your doctor.  This is important. How is this prevented?  Getting a yearly (annual) flu shot is the best way to avoid getting the flu. You may get the flu shot in late summer, fall, or winter. Ask your doctor when you should get your flu shot.  Wash your hands often or use hand sanitizer often.  Avoid contact with people who are sick during cold and flu season.  Eat healthy foods.  Drink plenty of fluids.  Get enough sleep.  Exercise regularly. Contact a doctor if:  You get new symptoms.  You have:  Chest pain.  Watery poop (diarrhea).  A fever.  Your cough gets worse.  You start to have more mucus.  You feel sick to your stomach (nauseous).  You throw up (vomit). Get help right away if:  You start to be short of breath or have trouble breathing.  Your skin or nails turn a bluish color.  You have very bad pain or stiffness in your neck.  You get a sudden headache.  You get sudden pain in your face or ear.  You cannot stop throwing up. This information is not intended to replace advice given to you by your health care provider. Make sure you discuss any questions you have with your health care provider. Document Released: 07/30/2008 Document Revised: 03/28/2016 Document Reviewed: 08/15/2015 Elsevier Interactive Patient Education  2017 [employer]  

## 2016-12-02 NOTE — Progress Notes (Signed)
Heather Shaffer 66 y.o.   Chief Complaint  Patient presents with  . Cough  . Fever  . Chills  . Generalized Body Aches    HISTORY OF PRESENT ILLNESS: This is a 66 y.o. female complaining of flu symptoms since last Friday.  Influenza  This is a new problem. The current episode started in the past 7 days (2-3 days ago). The problem occurs constantly. The problem has been gradually worsening. Associated symptoms include anorexia, arthralgias, chills, congestion, coughing, fatigue, a fever, myalgias, a sore throat and weakness. Pertinent negatives include no abdominal pain, chest pain, diaphoresis, headaches, nausea, neck pain, rash, urinary symptoms, vertigo or vomiting. The symptoms are aggravated by coughing. She has tried rest, acetaminophen and sleep for the symptoms. The treatment provided no relief.     Prior to Admission medications   Medication Sig Start Date End Date Taking? Authorizing Provider  gabapentin (NEURONTIN) 300 MG capsule Take 1 capsule (300 mg total) by mouth at bedtime. Take 1-2 capsules at bedtime 05/23/16  Yes Venia Carbon, MD  glipiZIDE (GLUCOTROL XL) 5 MG 24 hr tablet Take 1 tablet (5 mg total) by mouth daily with breakfast. 11/22/16  Yes Venia Carbon, MD  HYDROcodone-acetaminophen (NORCO/VICODIN) 5-325 MG tablet Take 0.5-1 tablets by mouth 2 (two) times daily as needed for moderate pain. 11/18/16  Yes Venia Carbon, MD  traMADol (ULTRAM) 50 MG tablet Take 1 tablet (50 mg total) by mouth 3 (three) times daily as needed. 05/23/16  Yes Venia Carbon, MD  oseltamivir (TAMIFLU) 75 MG capsule Take 1 capsule (75 mg total) by mouth 2 (two) times daily. 12/02/16 12/07/16  Horald Pollen, MD  promethazine-codeine [facility] WITH CODEINE) 6.25-10 MG/5ML syrup Take 5 mLs by mouth at bedtime as needed for cough. 12/02/16   Horald Pollen, MD    Allergies  Allergen Reactions  . Sulfamethoxazole-Trimethoprim     REACTION: tongue swells  . Latex Rash  .  Tdap [Diphth-Acell Pertussis-Tetanus] Rash    Patient Active Problem List   Diagnosis Date Noted  . Influenza with respiratory manifestation other than pneumonia 12/02/2016  . Tinnitus 11/18/2016  . Radiculopathy, lumbar region 02/29/2016  . Carpal tunnel syndrome 10/19/2015  . Advance directive discussed with patient 10/19/2015  . Peripheral neuropathy ([place]) 03/01/2015  . Osteoarthritis, knee 01/22/2012  . Routine general medical examination at a health care facility 06/17/2011  . BARRETT'S ESOPHAGUS, HX OF 02/01/2008  . NEPHROLITHIASIS, HX OF 02/01/2008  . Type 2 diabetes mellitus with neurological manifestations, controlled ([place]) 01/28/2008  . Degenerative disc disease, lumbar 01/28/2008    Past Medical History:  Diagnosis Date  . Allergy   . Back pain    Back injections at [facility] for back pain &  spinal stenosis  . Barrett's esophagus   . Carpal tunnel syndrome   . Diabetes mellitus   . Diverticulitis   . DVT (deep venous thrombosis) (HCC)    hx of 1998 post knee replacement  . Hemorrhoids   . Hiatal hernia   . History of nephrolithiasis   . Osteoarthritis, knee   . Uterine fibroids affecting pregnancy     Past Surgical History:  Procedure Laterality Date  . LAPAROSCOPY    . TOTAL KNEE ARTHROPLASTY     right    Social History   Social History  . Marital status: Legally Separated    Spouse name: N/A  . Number of children: 3  . Years of education: N/A   Occupational History  . Realtor,  Art therapist     retired  . [employer]     retired   Social History Main Topics  . Smoking status: Former Research scientist (life sciences)  . Smokeless tobacco: Never Used     Comment: quit 1975  . Alcohol use No  . Drug use: No  . Sexual activity: Not Currently    Birth control/ protection: Post-menopausal   Other Topics Concern  . Not on file   Social History Narrative   No living will   Daughter is health care POA Arsenio Loader)   Would accept resuscitation   Would probably  accept tube feeds    Family History  Problem Relation Age of Onset  . Cancer Sister   . Diabetes Son   . Heart disease Son     cardiomyopathy     Review of Systems  Constitutional: Positive for chills, fatigue and fever. Negative for diaphoresis.  HENT: Positive for congestion and sore throat. Negative for ear discharge, nosebleeds and sinus pain.   Eyes: Negative for discharge and redness.  Respiratory: Positive for cough. Negative for hemoptysis, shortness of breath and wheezing.   Cardiovascular: Negative for chest pain and palpitations.  Gastrointestinal: Positive for anorexia. Negative for abdominal pain, constipation, diarrhea, nausea and vomiting.  Genitourinary: Negative for dysuria and hematuria.  Musculoskeletal: Positive for arthralgias, joint pain and myalgias. Negative for neck pain.  Skin: Negative for rash.  Neurological: Positive for weakness. Negative for dizziness, vertigo, sensory change, focal weakness and headaches.  Endo/Heme/Allergies: Does not bruise/bleed easily.  Psychiatric/Behavioral: Negative.   All other systems reviewed and are negative.  Vitals:   12/02/16 1113  BP: 124/84  Pulse: (!) 115  Resp: 16  Temp: 99.6 F (37.6 C)     Physical Exam  Constitutional: She is oriented to person, place, and time. She appears well-developed and well-nourished.  HENT:  Head: Normocephalic and atraumatic.  Mouth/Throat: Posterior oropharyngeal erythema present.  Eyes: Conjunctivae and EOM are normal. Pupils are equal, round, and reactive to light.  Neck: Normal range of motion. Neck supple. No JVD present. No thyromegaly present.  Cardiovascular: Normal rate, regular rhythm and normal heart sounds.   Pulmonary/Chest: Effort normal and breath sounds normal. She has no wheezes. She has no rales.  Abdominal: Soft. She exhibits no distension. There is no tenderness.  Musculoskeletal: Normal range of motion.  Lymphadenopathy:    She has no cervical  adenopathy.  Neurological: She is alert and oriented to person, place, and time. No sensory deficit. She exhibits normal muscle tone.  Skin: Skin is warm and dry. Capillary refill takes less than 2 seconds.  Psychiatric: She has a normal mood and affect. Her behavior is normal.  Vitals reviewed.    ASSESSMENT & PLAN: Adeana was seen today for cough, fever, chills and generalized body aches.  Diagnoses and all orders for this visit:  Fever in adult -     POCT Influenza A/B  Influenza with respiratory manifestation other than pneumonia  Cough  Other orders -     oseltamivir (TAMIFLU) 75 MG capsule; Take 1 capsule (75 mg total) by mouth 2 (two) times daily. -     promethazine-codeine (PHENERGAN WITH CODEINE) 6.25-10 MG/5ML syrup; Take 5 mLs by mouth at bedtime as needed for cough.      Patient Instructions       IF you received an x-ray today, you will receive an invoice from [facility]. Please contact [facility] at [phone] with questions or concerns regarding your invoice.  IF you received labwork today, you will receive an invoice from [facility]. Please contact [facility] at [phone] with questions or concerns regarding your invoice.   Our billing staff will not be able to assist you with questions regarding bills from these companies.  You will be contacted with the lab results as soon as they are available. The fastest way to get your results is to activate your My Chart account. Instructions are located on the last page of this paperwork. If you have not heard from [place] regarding the results in 2 weeks, please contact this office.     Influenza, Adult Influenza ("the flu") is an infection in the lungs, nose, and throat (respiratory tract). It is caused by a virus. The flu causes many common cold symptoms, as well as a high fever and body aches. It can make you feel very sick. The flu spreads easily from person to person (is contagious).  Getting a flu shot (influenza vaccination) every year is the best way to prevent the flu. Follow these instructions at home:  Take over-the-counter and prescription medicines only as told by your doctor.  Use a cool mist humidifier to add moisture (humidity) to the air in your home. This can make it easier to breathe.  Rest as needed.  Drink enough fluid to keep your pee (urine) clear or pale yellow.  Cover your mouth and nose when you cough or sneeze.  Wash your hands with soap and water often, especially after you cough or sneeze. If you cannot use soap and water, use hand sanitizer.  Stay home from work or school as told by your doctor. Unless you are visiting your doctor, try to avoid leaving home until your fever has been gone for 24 hours without the use of medicine.  Keep all follow-up visits as told by your doctor. This is important. How is this prevented?  Getting a yearly (annual) flu shot is the best way to avoid getting the flu. You may get the flu shot in late summer, fall, or winter. Ask your doctor when you should get your flu shot.  Wash your hands often or use hand sanitizer often.  Avoid contact with people who are sick during cold and flu season.  Eat healthy foods.  Drink plenty of fluids.  Get enough sleep.  Exercise regularly. Contact a doctor if:  You get new symptoms.  You have:  Chest pain.  Watery poop (diarrhea).  A fever.  Your cough gets worse.  You start to have more mucus.  You feel sick to your stomach (nauseous).  You throw up (vomit). Get help right away if:  You start to be short of breath or have trouble breathing.  Your skin or nails turn a bluish color.  You have very bad pain or stiffness in your neck.  You get a sudden headache.  You get sudden pain in your face or ear.  You cannot stop throwing up. This information is not intended to replace advice given to you by your health care provider. Make sure you discuss  any questions you have with your health care provider. Document Released: 07/30/2008 Document Revised: 03/28/2016 Document Reviewed: 08/15/2015 Elsevier Interactive Patient Education  2017 Elsevier Inc.    Agustina Caroli, MD [facility]

## 2016-12-25 ENCOUNTER — Other Ambulatory Visit (INDEPENDENT_AMBULATORY_CARE_PROVIDER_SITE_OTHER): Payer: Medicare Other

## 2016-12-25 DIAGNOSIS — Z1211 Encounter for screening for malignant neoplasm of colon: Secondary | ICD-10-CM | POA: Diagnosis not present

## 2016-12-25 LAB — FECAL OCCULT BLOOD, IMMUNOCHEMICAL: Fecal Occult Bld: NEGATIVE

## 2017-08-28 ENCOUNTER — Other Ambulatory Visit: Payer: Self-pay | Admitting: Internal Medicine

## 2017-08-28 NOTE — Telephone Encounter (Signed)
Copied from [place] #[id number]. Topic: Quick Communication - See Telephone Encounter >> Aug 28, 2017  2:19 PM Corie Chiquito, [place] wrote: CRM for notification. See Telephone encounter for:  08/28/17.Patient returning a call that was made to her from the office. Person didn't leave a message and patient would like a call back from some one

## 2017-09-04 ENCOUNTER — Telehealth: Payer: Self-pay | Admitting: Internal Medicine

## 2017-09-04 NOTE — Telephone Encounter (Signed)
Copied from [place]. Topic: Quick Communication - See Telephone Encounter >> Sep 04, 2017  3:53 PM Cleaster Corin, NT wrote: CRM for notification. See Telephone encounter for:  09/04/17. Pt. Is out of test strips and needs refill

## 2017-09-04 NOTE — Telephone Encounter (Signed)
I never saw this message until it was routed to me this afternoon. We do not have test strips listed on her med list. I have asked her to call back with the name of her test strips so I can fill them. The note previously says see 09-04-17 note. This is the 09-04-17 note and there is not note as to what strips she uses. What I can see is that this message was originally taken 08-28-17 and just addressed today.

## 2017-09-04 NOTE — Telephone Encounter (Signed)
Copied from [place]. Topic: Quick Communication - See Telephone Encounter >> Sep 04, 2017  3:53 PM Cleaster Corin, NT wrote: CRM for notification. See Telephone encounter for:  09/04/17. Pt. Is completely out of test strips and needs refill

## 2017-09-05 ENCOUNTER — Encounter: Payer: Self-pay | Admitting: Internal Medicine

## 2017-09-05 MED ORDER — GLUCOSE BLOOD VI STRP: ORAL_STRIP | 3 refills | Status: DC

## 2017-09-05 MED ORDER — ONETOUCH ULTRA 2 W/DEVICE KIT: 1.0000 | PACK | Freq: Once | 0 refills | Status: AC

## 2017-09-05 MED ORDER — ONETOUCH DELICA LANCETS 33G MISC: 1.0000 [IU] | Freq: Every day | 3 refills | Status: DC

## 2017-09-05 MED ORDER — ONETOUCH DELICA LANCING DEV MISC: 1.0000 | Freq: Once | 0 refills | Status: AC

## 2017-09-05 NOTE — Telephone Encounter (Signed)
Communicated with pt through MyChart that she needed One Touch or Accu-chek. New supplies for One Touch sent to pharmacy.

## 2017-09-05 NOTE — Telephone Encounter (Signed)
Spoke to pt. She said she told the person who took the message what type of test strip she needed at the time. She is asking for Freestyle Lite. Not sure insurance will cover it. She is going to call and find out what her ins will cover.

## 2017-10-03 DIAGNOSIS — H524 Presbyopia: Secondary | ICD-10-CM | POA: Diagnosis not present

## 2017-10-03 DIAGNOSIS — E119 Type 2 diabetes mellitus without complications: Secondary | ICD-10-CM | POA: Diagnosis not present

## 2017-10-03 LAB — HM DIABETES EYE EXAM

## 2017-12-03 ENCOUNTER — Encounter: Payer: Self-pay | Admitting: Internal Medicine

## 2018-01-22 ENCOUNTER — Encounter: Payer: Self-pay | Admitting: Internal Medicine

## 2018-01-22 ENCOUNTER — Ambulatory Visit (INDEPENDENT_AMBULATORY_CARE_PROVIDER_SITE_OTHER): Payer: Medicare Other | Admitting: Internal Medicine

## 2018-01-22 VITALS — BP 124/80 | HR 54 | Temp 98.1°F | Ht 63.0 in | Wt 225.0 lb

## 2018-01-22 DIAGNOSIS — K219 Gastro-esophageal reflux disease without esophagitis: Secondary | ICD-10-CM

## 2018-01-22 DIAGNOSIS — E1149 Type 2 diabetes mellitus with other diabetic neurological complication: Secondary | ICD-10-CM | POA: Diagnosis not present

## 2018-01-22 DIAGNOSIS — Z7189 Other specified counseling: Secondary | ICD-10-CM | POA: Diagnosis not present

## 2018-01-22 DIAGNOSIS — Z0000 Encounter for general adult medical examination without abnormal findings: Secondary | ICD-10-CM | POA: Diagnosis not present

## 2018-01-22 DIAGNOSIS — M48061 Spinal stenosis, lumbar region without neurogenic claudication: Secondary | ICD-10-CM | POA: Diagnosis not present

## 2018-01-22 DIAGNOSIS — M5416 Radiculopathy, lumbar region: Secondary | ICD-10-CM

## 2018-01-22 DIAGNOSIS — Z1211 Encounter for screening for malignant neoplasm of colon: Secondary | ICD-10-CM

## 2018-01-22 LAB — CBC
HEMATOCRIT: 41.3 % (ref 36.0–46.0)
HEMOGLOBIN: 13.6 g/dL (ref 12.0–15.0)
MCHC: 32.8 g/dL (ref 30.0–36.0)
MCV: 84 fl (ref 78.0–100.0)
PLATELETS: 228 10*3/uL (ref 150.0–400.0)
RBC: 4.92 Mil/uL (ref 3.87–5.11)
RDW: 15.2 % (ref 11.5–15.5)
WBC: 5.4 10*3/uL (ref 4.0–10.5)

## 2018-01-22 LAB — HM DIABETES FOOT EXAM

## 2018-01-22 LAB — MICROALBUMIN / CREATININE URINE RATIO
Creatinine,U: 85.1 mg/dL
Microalb Creat Ratio: 0.8 mg/g (ref 0.0–30.0)

## 2018-01-22 LAB — HEMOGLOBIN A1C: HEMOGLOBIN A1C: 7.2 % — AB (ref 4.6–6.5)

## 2018-01-22 MED ORDER — GABAPENTIN 300 MG PO CAPS: 300.0000 mg | ORAL_CAPSULE | Freq: Every day | ORAL | 3 refills | Status: DC

## 2018-01-22 MED ORDER — ATORVASTATIN CALCIUM 10 MG PO TABS: 10.0000 mg | ORAL_TABLET | Freq: Every day | ORAL | 3 refills | Status: DC

## 2018-01-22 MED ORDER — HYDROCODONE-ACETAMINOPHEN 5-325 MG PO TABS: 0.5000 | ORAL_TABLET | Freq: Two times a day (BID) | ORAL | 0 refills | Status: DC | PRN

## 2018-01-22 NOTE — Assessment & Plan Note (Signed)
Hasn't been taking the glipizide due to hypoglycemia If fair control, will ask her to be more consistent (will give 2.5mg  dose then) If bad--will change to [place] Gabapentin for the neuropathy Will start statin at my request

## 2018-01-22 NOTE — Progress Notes (Signed)
Hearing Screening   125Hz  250Hz  500Hz  1000Hz  2000Hz  3000Hz  4000Hz  6000Hz  8000Hz   Right ear:   40 40 40  40    Left ear:   40 40 40  40    Vision Screening Comments: Eye exam at [facility] in Nov 2018

## 2018-01-22 NOTE — Assessment & Plan Note (Signed)
See social history 

## 2018-01-22 NOTE — Assessment & Plan Note (Signed)
I have personally reviewed the Medicare Annual Wellness questionnaire and have noted 1. The patient's medical and social history 2. Their use of alcohol, tobacco or illicit drugs 3. Their current medications and supplements 4. The patient's functional ability including ADL's, fall risks, home safety risks and hearing or visual             impairment. 5. Diet and physical activities 6. Evidence for depression or mood disorders  The patients weight, height, BMI and visual acuity have been recorded in the chart I have made referrals, counseling and provided education to the patient based review of the above and I have provided the pt with a written personalized care plan for preventive services.  I have provided you with a copy of your personalized plan for preventive services. Please take the time to review along with your updated medication list.  Still prefers no immunizations Thinks she had mammogram--will check FIT Tries to stay active

## 2018-01-22 NOTE — Progress Notes (Signed)
Subjective:    Patient ID: Heather Shaffer, female    DOB: [birth year], 67 y.o.   MRN: [id number]  HPI Here for Medicare wellness visit and follow up of chronic health conditions Reviewed form and advanced directives Reviewed other doctors No alcohol or tobacco Still no exercise--discussed again. Stays active with work around [employer] fine. 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 gabapentin (NEURONTIN) 300 MG capsule Take 1 capsule (300 mg total) by mouth at bedtime. Take 1-2 capsules at bedtime 180 capsule 3  . glipiZIDE (GLUCOTROL XL) 5 MG 24 hr tablet Take 1 tablet (5 mg total) by mouth daily with breakfast. 90 tablet 3  . glucose blood (ONE TOUCH ULTRA TEST) test strip Use to check blood sugar once a day Dx Code E11.49 100 each 3  . HYDROcodone-acetaminophen (NORCO/VICODIN) 5-325 MG tablet Take 0.5-1 tablets by mouth 2 (two) times daily as needed for moderate pain. 30 tablet 0  . ONETOUCH DELICA LANCETS 29B MISC 1 Units by Does not apply route daily. Use  1 a day to check blood sugar Dx Code E11.49 100 each 3   No current facility-administered medications on file prior to visit.     Allergies  Allergen Reactions  . Sulfamethoxazole-Trimethoprim     REACTION: tongue swells  . Latex Rash  . Tdap [Diphth-Acell Pertussis-Tetanus] Rash    Past Medical History:  Diagnosis Date  . Allergy   . Back pain    Back injections at [facility] for back pain &  spinal stenosis  . Barrett's esophagus   . Carpal tunnel syndrome   . Diabetes mellitus   . Diverticulitis   . DVT (deep venous thrombosis) (HCC)    hx of 1998 post knee replacement  . Hemorrhoids   . Hiatal hernia   . History of nephrolithiasis   . Osteoarthritis, knee   . Uterine fibroids affecting pregnancy     Past Surgical History:  Procedure Laterality Date  . LAPAROSCOPY    . TOTAL KNEE ARTHROPLASTY     right    Family History  Problem Relation Age of Onset  . Cancer Sister   . Diabetes Son   . Heart disease Son        cardiomyopathy    Social History   Socioeconomic History  . Marital status: Legally Separated    Spouse name: Not on file  . Number of children: 3  . Years of education: Not on file  . Highest education level: Not on file  Occupational History  . Occupation: Cabin crew, Art therapist  Comment: retired  . Occupation: [employer]    Comment: retired  Scientific laboratory technician  . Financial resource strain: Not on file  . Food insecurity:    Worry: Not on file    Inability: Not on file  . Transportation needs:    Medical: Not on file    Non-medical: Not on file  Tobacco Use  . Smoking status: Former Research scientist (life sciences)  . Smokeless tobacco: Never Used  . Tobacco comment: quit 1975  Substance and Sexual Activity  . Alcohol use: No  . Drug use: No  . Sexual activity: Not Currently    Birth control/protection: Post-menopausal  Lifestyle  . Physical activity:    Days per week: Not on file    Minutes per session: Not on file  . Stress: Not on file    Relationships  . Social connections:    Talks on phone: Not on file    Gets together: Not on file    Attends religious service: Not on file    Active member of club or organization: Not on file    Attends meetings of clubs or organizations: Not on file    Relationship status: Not on file  . Intimate partner violence:    Fear of current or ex partner: Not on file    Emotionally abused: Not on file    Physically abused: Not on file    Forced sexual activity: Not on file  Other Topics Concern  . Not on file  Social History Narrative   No living will   Daughter should be health care POA Arsenio Loader)   Would accept resuscitation   Would probably accept tube feeds   Review of Systems Has lost 12# since last yearly visit--being careful with eating Sleeps okay Wears seat belt Few teeth are okay--keeps up with dentist No skin lesions Bowels are okay---no blood Voids okay    Objective:   Physical Exam  Constitutional: She is oriented to person, place, and time. She appears well-developed. No distress.  HENT:  Mouth/Throat: Oropharynx is clear and moist. No oropharyngeal exudate.  Neck: No thyromegaly present.  Cardiovascular: Normal rate, regular rhythm, normal heart sounds and intact distal pulses. Exam reveals no gallop.  No murmur heard. Pulmonary/Chest: Effort normal and breath sounds normal. No respiratory distress. She has no wheezes. She has no rales.  Abdominal: Soft. There is no tenderness.  Musculoskeletal: She exhibits no edema.  Flat foot on right with medial callous  Lymphadenopathy:    She has no cervical adenopathy.  Neurological: She is alert and oriented to person, place, and time.  President--- "Daisy Floro, Obama, Clinton---Bush" [phone] [id number] Recall 3/3  Decreased sensation in feet  Skin:  No foot lesions  Psychiatric: She has a normal mood and affect. Her behavior is normal.          Assessment & Plan:

## 2018-01-22 NOTE — Assessment & Plan Note (Signed)
Asked her to take PPI for any persistent symptoms Past Barrett's

## 2018-01-22 NOTE — Assessment & Plan Note (Signed)
Uses the hydrocodone rarely when severe

## 2018-01-23 LAB — COMPREHENSIVE METABOLIC PANEL
ALBUMIN: 4.2 g/dL (ref 3.5–5.2)
ALK PHOS: 88 U/L (ref 39–117)
ALT: 11 U/L (ref 0–35)
AST: 18 U/L (ref 0–37)
BUN: 15 mg/dL (ref 6–23)
CHLORIDE: 103 meq/L (ref 96–112)
CO2: 29 mEq/L (ref 19–32)
Calcium: 10.4 mg/dL (ref 8.4–10.5)
Creatinine, Ser: 1.03 mg/dL (ref 0.40–1.20)
GFR: 68.66 mL/min (ref >60.00)
Glucose, Bld: 143 mg/dL — ABNORMAL HIGH (ref 70–99)
POTASSIUM: 4.4 meq/L (ref 3.5–5.1)
SODIUM: 141 meq/L (ref 135–145)
TOTAL PROTEIN: 7.9 g/dL (ref 6.0–8.3)
Total Bilirubin: 0.5 mg/dL (ref 0.2–1.2)

## 2018-01-23 LAB — LIPID PANEL
CHOLESTEROL: 192 mg/dL (ref 0–200)
HDL: 54.9 mg/dL (ref >39.00)
LDL CALC: 129 mg/dL — AB (ref 0–99)
NONHDL: 136.93
Total CHOL/HDL Ratio: 3
Triglycerides: 40 mg/dL (ref 0.0–149.0)
VLDL: 8 mg/dL (ref 0.0–40.0)

## 2018-01-24 LAB — PAIN MGMT, PROFILE 8 W/CONF, U
6 Acetylmorphine: NEGATIVE ng/mL (ref <10)
ALCOHOL METABOLITES: NEGATIVE ng/mL (ref <500)
Amphetamines: NEGATIVE ng/mL (ref <500)
Benzodiazepines: NEGATIVE ng/mL (ref <100)
Buprenorphine, Urine: NEGATIVE ng/mL (ref <5)
COCAINE METABOLITE: NEGATIVE ng/mL (ref <150)
Creatinine: 76.3 mg/dL
MDMA: NEGATIVE ng/mL (ref <500)
Marijuana Metabolite: NEGATIVE ng/mL (ref <20)
OPIATES: NEGATIVE ng/mL (ref <100)
OXIDANT: NEGATIVE ug/mL (ref <200)
OXYCODONE: NEGATIVE ng/mL (ref <100)
pH: 6.38 (ref 4.5–9.0)

## 2018-01-26 ENCOUNTER — Telehealth: Payer: Self-pay | Admitting: Internal Medicine

## 2018-01-26 MED ORDER — GABAPENTIN 300 MG PO CAPS: 300.0000 mg | ORAL_CAPSULE | Freq: Every evening | ORAL | 1 refills | Status: DC | PRN

## 2018-01-26 NOTE — Telephone Encounter (Signed)
Please confirm with her how she is taking it and correct it with the pharmacy

## 2018-01-26 NOTE — Telephone Encounter (Signed)
Copied from [place] [phone]. Topic: Quick Communication - See Telephone Encounter >> Jan 26, 2018 10:56 AM Ether Griffins B wrote: CRM for notification. See Telephone encounter for: 01/26/18. [facility] calling needing clarification on the gabapentin (NEURONTIN) 300 MG capsule directions. Contact number [phone]. Ref # [id number]

## 2018-01-26 NOTE — Telephone Encounter (Signed)
Spoke to pt. She said she takes 1 at bedtime as needed.

## 2018-01-26 NOTE — Telephone Encounter (Signed)
Sent new rx to [facility] with the directions per pt.

## 2018-01-26 NOTE — Telephone Encounter (Signed)
On medication list for gabapentin 300 mg there are 2 sets of instructions; do you want pt to take Gabapentin one cap at hs or 1-2 caps at hs.Please advise.

## 2018-03-17 ENCOUNTER — Other Ambulatory Visit (INDEPENDENT_AMBULATORY_CARE_PROVIDER_SITE_OTHER): Payer: Medicare Other

## 2018-03-17 DIAGNOSIS — Z1211 Encounter for screening for malignant neoplasm of colon: Secondary | ICD-10-CM | POA: Diagnosis not present

## 2018-03-17 LAB — FECAL OCCULT BLOOD, IMMUNOCHEMICAL: FECAL OCCULT BLD: NEGATIVE

## 2018-07-27 ENCOUNTER — Ambulatory Visit: Payer: Medicare Other | Admitting: Internal Medicine

## 2018-07-29 ENCOUNTER — Ambulatory Visit: Payer: Medicare Other | Admitting: Internal Medicine

## 2018-07-29 ENCOUNTER — Encounter: Payer: Self-pay | Admitting: Internal Medicine

## 2018-07-29 VITALS — BP 110/84 | HR 57 | Temp 98.1°F | Ht 63.0 in | Wt 212.0 lb

## 2018-07-29 DIAGNOSIS — E1149 Type 2 diabetes mellitus with other diabetic neurological complication: Secondary | ICD-10-CM | POA: Diagnosis not present

## 2018-07-29 LAB — POCT GLYCOSYLATED HEMOGLOBIN (HGB A1C): Hemoglobin A1C: 7.4 % — AB (ref 4.0–5.6)

## 2018-07-29 MED ORDER — METFORMIN HCL ER 500 MG PO TB24: 500.0000 mg | ORAL_TABLET | ORAL | 3 refills | Status: DC

## 2018-07-29 NOTE — Progress Notes (Signed)
Subjective:    Patient ID: Heather Shaffer, female    DOB: Apr 13, 1951, 67 y.o.   MRN: [id number]  HPI Here for follow up of diabetes  Only takes glipizide rarely --it caused hypoglycemic reactions Maybe only once in the past month Only checks occasionally---usually in the 130's. Occasionally up in 170's Numbness in feet is about the same Does have 1 painful spot on right foot--due to arch damage Uses the gabapentin prn  Never took the atorvastain  Current Outpatient Medications on File Prior to Visit  Medication Sig Dispense Refill  . gabapentin (NEURONTIN) 300 MG capsule Take 1 capsule (300 mg total) by mouth at bedtime as needed. 90 capsule 1  . glipiZIDE (GLUCOTROL XL) 5 MG 24 hr tablet Take 1 tablet (5 mg total) by mouth daily with breakfast. 90 tablet 3  . glucose blood (ONE TOUCH ULTRA TEST) test strip Use to check blood sugar once a day Dx Code E11.49 100 each 3  . HYDROcodone-acetaminophen (NORCO/VICODIN) 5-325 MG tablet Take 0.5-1 tablets by mouth 2 (two) times daily as needed for moderate pain. 30 tablet 0  . ONETOUCH DELICA LANCETS 31S MISC 1 Units by Does not apply route daily. Use 1 a day to check blood sugar Dx Code E11.49 100 each 3   No current facility-administered medications on file prior to visit.     Allergies  Allergen Reactions  . Sulfamethoxazole-Trimethoprim     REACTION: tongue swells  . Latex Rash  . Tdap [Tetanus-Diphth-Acell Pertussis] Rash    Past Medical History:  Diagnosis Date  . Allergy   . Back pain    Back injections at [facility] for back pain &  spinal stenosis  . Barrett's esophagus   . Carpal tunnel syndrome   . Diabetes mellitus   . Diverticulitis   . DVT (deep venous thrombosis) (HCC)    hx of 1998 post knee replacement  . Hemorrhoids   . Hiatal hernia   . History of nephrolithiasis   . Osteoarthritis, knee   . Uterine fibroids affecting pregnancy     Past Surgical History:  Procedure Laterality Date  . LAPAROSCOPY    .  TOTAL KNEE ARTHROPLASTY     right    Family History  Problem Relation Age of Onset  . Cancer Sister   . Diabetes Son   . Heart disease Son        cardiomyopathy    Social History   Socioeconomic History  . Marital status: Legally Separated    Spouse name: Not on file  . Number of children: 3  . Years of education: Not on file  . Highest education level: Not on file  Occupational History  . Occupation: Cabin crew, Art therapist    Comment: retired  . Occupation: [employer]    Comment: retired  Scientific laboratory technician  . Financial resource strain: Not on file  . Food insecurity:    Worry: Not on file    Inability: Not on file  . Transportation needs:    Medical: Not on file    Non-medical: Not on file  Tobacco Use  . Smoking status: Former Research scientist (life sciences)  . Smokeless tobacco: Never Used  . Tobacco comment: quit 1975  Substance and Sexual Activity  . Alcohol use: No  . Drug use: No  . Sexual activity: Not Currently    Birth control/protection: Post-menopausal  Lifestyle  . Physical activity:    Days per week: Not on file    Minutes per session: Not on  file  . Stress: Not on file  Relationships  . Social connections:    Talks on phone: Not on file    Gets together: Not on file    Attends religious service: Not on file    Active member of club or organization: Not on file    Attends meetings of clubs or organizations: Not on file    Relationship status: Not on file  . Intimate partner violence:    Fear of current or ex partner: Not on file    Emotionally abused: Not on file    Physically abused: Not on file    Forced sexual activity: Not on file  Other Topics Concern  . Not on file  Social History Narrative   No living will   Daughter should be health care POA Arsenio Loader)   Would accept resuscitation   Would probably accept tube feeds   Review of Systems Has lost 13# by being careful Sleeping okay  No chest pain No SOB    Objective:   Physical Exam    Constitutional: She appears well-developed. No distress.  Neck: No thyromegaly present.  Cardiovascular: Normal rate, regular rhythm and normal heart sounds. Exam reveals no gallop.  No murmur heard. Normal pulse on left Faint pulse on right  Respiratory: Effort normal and breath sounds normal. No respiratory distress. She has no wheezes. She has no rales.  Musculoskeletal:  Complete loss of right arch No lesions  Lymphadenopathy:    She has no cervical adenopathy.  Psychiatric: She has a normal mood and affect. Her behavior is normal.           Assessment & Plan:

## 2018-07-29 NOTE — Assessment & Plan Note (Signed)
Lab Results  Component Value Date   HGBA1C 7.4 (A) 07/29/2018   Still with good control on no medications Will stop the glipizide--wasn't taking due to hypoglycemia Will have her restart the metformin--even if just a few days a week Never took the atorvastatin---asked her to try this once a week at least (she still has left over Rx)

## 2018-10-14 DIAGNOSIS — E119 Type 2 diabetes mellitus without complications: Secondary | ICD-10-CM | POA: Diagnosis not present

## 2018-11-16 DIAGNOSIS — Z124 Encounter for screening for malignant neoplasm of cervix: Secondary | ICD-10-CM | POA: Diagnosis not present

## 2018-11-16 DIAGNOSIS — Z1231 Encounter for screening mammogram for malignant neoplasm of breast: Secondary | ICD-10-CM | POA: Diagnosis not present

## 2018-11-17 LAB — HM PAP SMEAR: HM Pap smear: NORMAL

## 2018-11-18 DIAGNOSIS — Z1231 Encounter for screening mammogram for malignant neoplasm of breast: Secondary | ICD-10-CM | POA: Diagnosis not present

## 2018-12-03 ENCOUNTER — Encounter: Payer: Self-pay | Admitting: Internal Medicine

## 2019-01-01 ENCOUNTER — Telehealth: Payer: Self-pay | Admitting: Internal Medicine

## 2019-01-01 MED ORDER — GLUCOSE BLOOD VI STRP: ORAL_STRIP | 3 refills | Status: DC

## 2019-01-01 NOTE — Telephone Encounter (Signed)
Rx sent electronically.  

## 2019-01-01 NOTE — Telephone Encounter (Signed)
Patient is out of her One Touch Ultra  test strips. Patient called [facility] and they told her she didn't have any refills.  Please call rx in to [facility].

## 2019-01-27 ENCOUNTER — Encounter: Payer: Medicare Other | Admitting: Internal Medicine

## 2019-01-31 DIAGNOSIS — T07XXXA Unspecified multiple injuries, initial encounter: Secondary | ICD-10-CM | POA: Diagnosis not present

## 2019-02-04 ENCOUNTER — Ambulatory Visit: Payer: Self-pay

## 2019-02-04 NOTE — Telephone Encounter (Signed)
Patient called and says she went to the [facility] for a tick removal on last Sunday, the tick was live on her left side neck near the clavicle. She says last night, that area turned red, it itches and she noticed a knot that moves when she touches it. She says it's pea-sized and painful when touched, a 2-3, no fever. I advised someone from the office will call tomorrow with an appointment, care advice given, patient verbalized understanding.  Reason for Disposition . [1] Swelling is painful to touch AND [2] no fever  Answer Assessment - Initial Assessment Questions 1. APPEARANCE of SWELLING: "What does it look like?" (e.g., lymph node, insect bite, mole)     Pea sized 2. SIZE: "How large is the swelling?" (inches, cm or compare to coins)     Pea sized 3. LOCATION: "Where is the swelling located?"     Left clavicle 4. ONSET: "When did the swelling start?"     Yesterday the knot was noticed and it moved back when I touched it 5. PAIN: "Is it painful?" If so, ask: "How much?"     Yes, when I touch it, a 2-3 6. ITCH: "Does it itch?" If so, ask: "How much?"     No 7. CAUSE: "What do you think caused the swelling?"     From the tick bite 8. OTHER SYMPTOMS: "Do you have any other symptoms?" (e.g., fever)     Rash to the tick bite area near the left clavicle  Protocols used: SKIN LUMP OR LOCALIZED SWELLING-A-AH

## 2019-02-05 ENCOUNTER — Other Ambulatory Visit: Payer: Self-pay

## 2019-02-05 ENCOUNTER — Ambulatory Visit: Payer: Medicare Other | Admitting: Internal Medicine

## 2019-02-05 ENCOUNTER — Encounter: Payer: Self-pay | Admitting: Internal Medicine

## 2019-02-05 DIAGNOSIS — W57XXXA Bitten or stung by nonvenomous insect and other nonvenomous arthropods, initial encounter: Secondary | ICD-10-CM

## 2019-02-05 DIAGNOSIS — T07XXXS Unspecified multiple injuries, sequela: Secondary | ICD-10-CM | POA: Diagnosis not present

## 2019-02-05 DIAGNOSIS — R030 Elevated blood-pressure reading, without diagnosis of hypertension: Secondary | ICD-10-CM | POA: Diagnosis not present

## 2019-02-05 MED ORDER — CEPHALEXIN 500 MG PO CAPS: 500.0000 mg | ORAL_CAPSULE | Freq: Three times a day (TID) | ORAL | 0 refills | Status: DC

## 2019-02-05 NOTE — Progress Notes (Signed)
   Subjective:    Patient ID: Heather Shaffer, female    DOB: 28-Dec-1950, 68 y.o.   MRN: [id number]  HPI Video visit for evaluation of tick bite wound Unable to set up her computer to do video She will either send a picture on MyChart or go back to [facility] (where she went first for this)   Review of Systems     Objective:   Physical Exam         Assessment & Plan:

## 2019-02-05 NOTE — Telephone Encounter (Signed)
I spoke to her after seeing the picture--which is very poor quality.  I do see a clearly elevated area around the mid clavicle--and that is where the tick bite was Not squishy (my question to see if fluctuance)----but tender and moves some (like fluid)\  Advised warm compresses Will treat with 5 days of cephalexin and may need in office reevaluation if not better next week

## 2019-02-05 NOTE — Telephone Encounter (Signed)
Okay---will assess at that visit

## 2019-02-05 NOTE — Assessment & Plan Note (Signed)
She will try to send a picture or go back to [facility]

## 2019-02-05 NOTE — Telephone Encounter (Signed)
I spoke with pt; pt had tick removed on 01/31/19 [facility]. On 02/03/19 developed rash and pea sized nodule on neck where tick was removed. No fever, cough,SOB, no travel or no known exposure to covid or flu. Pt scheduled webex with Dr Silvio Pate 02/05/19 at [facility].

## 2019-02-16 ENCOUNTER — Ambulatory Visit (INDEPENDENT_AMBULATORY_CARE_PROVIDER_SITE_OTHER): Payer: Medicare Other | Admitting: Internal Medicine

## 2019-02-16 ENCOUNTER — Other Ambulatory Visit: Payer: Self-pay

## 2019-02-16 ENCOUNTER — Encounter: Payer: Self-pay | Admitting: Internal Medicine

## 2019-02-16 VITALS — BP 118/74 | HR 72 | Temp 97.9°F | Ht 63.0 in | Wt 206.0 lb

## 2019-02-16 DIAGNOSIS — E11610 Type 2 diabetes mellitus with diabetic neuropathic arthropathy: Secondary | ICD-10-CM

## 2019-02-16 DIAGNOSIS — E1149 Type 2 diabetes mellitus with other diabetic neurological complication: Secondary | ICD-10-CM | POA: Diagnosis not present

## 2019-02-16 DIAGNOSIS — M5416 Radiculopathy, lumbar region: Secondary | ICD-10-CM

## 2019-02-16 DIAGNOSIS — S1096XD Insect bite of unspecified part of neck, subsequent encounter: Secondary | ICD-10-CM

## 2019-02-16 DIAGNOSIS — W57XXXD Bitten or stung by nonvenomous insect and other nonvenomous arthropods, subsequent encounter: Secondary | ICD-10-CM | POA: Diagnosis not present

## 2019-02-16 DIAGNOSIS — M48061 Spinal stenosis, lumbar region without neurogenic claudication: Secondary | ICD-10-CM

## 2019-02-16 LAB — LIPID PANEL
Cholesterol: 182 mg/dL (ref 0–200)
HDL: 59.2 mg/dL (ref >39.00)
LDL Cholesterol: 113 mg/dL — ABNORMAL HIGH (ref 0–99)
NonHDL: 123.07
Total CHOL/HDL Ratio: 3
Triglycerides: 51 mg/dL (ref 0.0–149.0)
VLDL: 10.2 mg/dL (ref 0.0–40.0)

## 2019-02-16 LAB — CBC
HCT: 40.8 % (ref 36.0–46.0)
Hemoglobin: 13.5 g/dL (ref 12.0–15.0)
MCHC: 33 g/dL (ref 30.0–36.0)
MCV: 84.6 fl (ref 78.0–100.0)
Platelets: 203 10*3/uL (ref 150.0–400.0)
RBC: 4.82 Mil/uL (ref 3.87–5.11)
RDW: 15.2 % (ref 11.5–15.5)
WBC: 4.5 10*3/uL (ref 4.0–10.5)

## 2019-02-16 LAB — T4, FREE: Free T4: 1.07 ng/dL (ref 0.60–1.60)

## 2019-02-16 LAB — MICROALBUMIN / CREATININE URINE RATIO
Creatinine,U: 122.7 mg/dL
Microalb Creat Ratio: 0.6 mg/g (ref 0.0–30.0)
Microalb, Ur: 0.7 mg/dL (ref 0.0–1.9)

## 2019-02-16 LAB — COMPREHENSIVE METABOLIC PANEL
ALT: 19 U/L (ref 0–35)
AST: 27 U/L (ref 0–37)
Albumin: 4.4 g/dL (ref 3.5–5.2)
Alkaline Phosphatase: 87 U/L (ref 39–117)
BUN: 8 mg/dL (ref 6–23)
CO2: 31 mEq/L (ref 19–32)
Calcium: 10.4 mg/dL (ref 8.4–10.5)
Chloride: 101 mEq/L (ref 96–112)
Creatinine, Ser: 1.12 mg/dL (ref 0.40–1.20)
GFR: 58.46 mL/min — ABNORMAL LOW (ref >60.00)
Glucose, Bld: 137 mg/dL — ABNORMAL HIGH (ref 70–99)
Potassium: 4.4 mEq/L (ref 3.5–5.1)
Sodium: 140 mEq/L (ref 135–145)
Total Bilirubin: 0.4 mg/dL (ref 0.2–1.2)
Total Protein: 7.8 g/dL (ref 6.0–8.3)

## 2019-02-16 LAB — HEMOGLOBIN A1C: Hgb A1c MFr Bld: 7.5 % — ABNORMAL HIGH (ref 4.6–6.5)

## 2019-02-16 LAB — HM DIABETES FOOT EXAM

## 2019-02-16 MED ORDER — TRIAMCINOLONE ACETONIDE 0.1 % EX CREA: 1.0000 "application " | TOPICAL_CREAM | Freq: Two times a day (BID) | CUTANEOUS | 1 refills | Status: DC | PRN

## 2019-02-16 NOTE — Assessment & Plan Note (Signed)
Some chronic pain but does okay for now No action

## 2019-02-16 NOTE — Assessment & Plan Note (Signed)
Some improvement in pain Rarely uses the hydrocodone The gabapentin does help

## 2019-02-16 NOTE — Assessment & Plan Note (Signed)
Seems to have good control on low dose metformin Intolerant of statins---wouldn't take the weekly atorvastatin

## 2019-02-16 NOTE — Assessment & Plan Note (Signed)
No inflammation Mass likely node or cyst---and is much better now No action needed

## 2019-02-16 NOTE — Progress Notes (Signed)
Subjective:    Patient ID: Heather Shaffer, female    DOB: Jul 25, 1951, 69 y.o.   MRN: [id number]  HPI Here due to worsening rash around a tick bite Also for review of her diabetes and other chronic health conditions  Tick removed on 3/29 Was on for 1 day---likely close to 24 hours---had showered after being in yard and the next day she noted itching Removed at Urgent Care---no treatment indicated at that time (low left anterior neck) Then bit by something else--on back of neck (while outside) Then got nodule at that location Rash on neck and chest---also arms (though they are better) Ongoing itching Not sick, no fever, etc  Checking sugars once a week--- usually in 120's Taking the metformin --- 1/2 tab daily Currently no sensation problems in feet ----unclear if past neuropathy from this or spinal stenosis Uses the gabapentin prn (and hydrocodone rarely) Not taking the statin--wasn't willing to try  No chest pain No SOB Has had some heart racing---noticed after taking doxy from the [facility] (went back for second visit there)  Current Outpatient Medications on File Prior to Visit  Medication Sig Dispense Refill  . gabapentin (NEURONTIN) 300 MG capsule Take 1 capsule (300 mg total) by mouth at bedtime as needed. 90 capsule 1  . glucose blood (ONE TOUCH ULTRA TEST) test strip Use to check blood sugar once a day Dx Code E11.49 100 each 3  . HYDROcodone-acetaminophen (NORCO/VICODIN) 5-325 MG tablet Take 0.5-1 tablets by mouth 2 (two) times daily as needed for moderate pain. 30 tablet 0  . metFORMIN (GLUCOPHAGE-XR) 500 MG 24 hr tablet Take 1 tablet (500 mg total) by mouth every other day. 45 tablet 3  . ONETOUCH DELICA LANCETS 31S MISC 1 Units by Does not apply route daily. Use 1 a day to check blood sugar Dx Code E11.49 100 each 3   No current facility-administered medications on file prior to visit.     Allergies  Allergen Reactions  . Sulfamethoxazole-Trimethoprim    REACTION: tongue swells  . Latex Rash  . Tdap [Tetanus-Diphth-Acell Pertussis] Rash    Past Medical History:  Diagnosis Date  . Allergy   . Back pain    Back injections at [facility] for back pain &  spinal stenosis  . Barrett's esophagus   . Carpal tunnel syndrome   . Diabetes mellitus   . Diverticulitis   . DVT (deep venous thrombosis) (HCC)    hx of 1998 post knee replacement  . Hemorrhoids   . Hiatal hernia   . History of nephrolithiasis   . Osteoarthritis, knee   . Uterine fibroids affecting pregnancy     Past Surgical History:  Procedure Laterality Date  . LAPAROSCOPY    . TOTAL KNEE ARTHROPLASTY     right    Family History  Problem Relation Age of Onset  . Cancer Sister   . Diabetes Son   . Heart disease Son        cardiomyopathy    Social History   Socioeconomic History  . Marital status: Legally Separated    Spouse name: Not on file  . Number of children: 3  . Years of education: Not on file  . Highest education level: Not on file  Occupational History  . Occupation: Cabin crew, Art therapist    Comment: retired  . Occupation: [employer]    Comment: retired  Scientific laboratory technician  . Financial resource strain: Not on file  . Food insecurity:    Worry:  Not on file    Inability: Not on file  . Transportation needs:    Medical: Not on file    Non-medical: Not on file  Tobacco Use  . Smoking status: Former Research scientist (life sciences)  . Smokeless tobacco: Never Used  . Tobacco comment: quit 1975  Substance and Sexual Activity  . Alcohol use: No  . Drug use: No  . Sexual activity: Not Currently    Birth control/protection: Post-menopausal  Lifestyle  . Physical activity:    Days per week: Not on file    Minutes per session: Not on file  . Stress: Not on file  Relationships  . Social connections:    Talks on phone: Not on file    Gets together: Not on file    Attends religious service: Not on file    Active member of club or organization: Not on file    Attends  meetings of clubs or organizations: Not on file    Relationship status: Not on file  . Intimate partner violence:    Fear of current or ex partner: Not on file    Emotionally abused: Not on file    Physically abused: Not on file    Forced sexual activity: Not on file  Other Topics Concern  . Not on file  Social History Narrative   No living will   Daughter should be health care POA Arsenio Loader)   Would accept resuscitation   Would probably accept tube feeds   Review of Systems Appetite is good Trying to lose weight ---- down almost 20# in past year Does exercise regularly on elliptical Discussed adding resistance work Sleeps is not good since the rash---because of the itching Has uses a new soap    Objective:   Physical Exam  Constitutional: She is oriented to person, place, and time. She appears well-developed. No distress.  Neck: No thyromegaly present.  Cardiovascular: Normal rate, regular rhythm and normal heart sounds. Exam reveals no gallop.  No murmur heard. Faint pedal pulses  Respiratory: Effort normal and breath sounds normal. No respiratory distress. She has no wheezes. She has no rales.  GI: Soft. There is no abdominal tenderness.  Musculoskeletal:        General: No edema.     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 Metatarsal pushed up on dorsum  Lymphadenopathy:    She has no cervical adenopathy.  Neurological: She is alert and oriented to person, place, and time.  Skin:  Non specific rash on upper chest and left posterior neck. Small mass (~6-29mm) above mid left clavicle---actually moved under my finger and went under clavicle. No inflammation, etc (??node vs cyst) No inflammation around past tick bite site           Assessment & Plan:

## 2019-05-24 ENCOUNTER — Ambulatory Visit (INDEPENDENT_AMBULATORY_CARE_PROVIDER_SITE_OTHER): Payer: Medicare Other | Admitting: Family Medicine

## 2019-05-24 ENCOUNTER — Other Ambulatory Visit: Payer: Self-pay

## 2019-05-24 ENCOUNTER — Encounter: Payer: Self-pay | Admitting: Family Medicine

## 2019-05-24 VITALS — BP 178/82 | HR 68 | Temp 98.2°F | Resp 18 | Ht 63.0 in | Wt 223.5 lb

## 2019-05-24 DIAGNOSIS — H6122 Impacted cerumen, left ear: Secondary | ICD-10-CM

## 2019-05-24 NOTE — Progress Notes (Signed)
   Subjective:     Heather Shaffer is a 68 y.o. female presenting for Ear Problem (something in the left ear. Noticed it a few weeks ago.)     HPI  #Ear pain - x a few weeks - endorses pain - felt something in the ear - got a q-tip to try and clean but difficulty getting it there - put alcohol in there and still felt there was something in the ear - sister is a nurse and looked in the ear and tried to get it out with a scoop w/o success - whatever was there was dark and not moving - not itchy    Review of Systems  Constitutional: Negative for chills and fever.  HENT: Positive for ear pain, hearing loss and tinnitus (chronic). Negative for congestion and ear discharge.      Social History   Tobacco Use  Smoking Status Former Smoker  Smokeless Tobacco Never Used  Tobacco Comment   quit 1975        Objective:    BP Readings from Last 3 Encounters:  05/24/19 (!) 178/82  02/16/19 118/74  07/29/18 110/84   Wt Readings from Last 3 Encounters:  05/24/19 223 lb 8 oz (101.4 kg)  02/16/19 206 lb (93.4 kg)  07/29/18 212 lb (96.2 kg)    BP (!) 178/82   Pulse 68   Temp 98.2 F (36.8 C)   Resp 18   Ht 5\' 3"  (1.6 m)   Wt 223 lb 8 oz (101.4 kg)   BMI 39.59 kg/m    Physical Exam Constitutional:      General: She is not in acute distress.    Appearance: She is well-developed. She is not diaphoretic.  HENT:     Head: Normocephalic and atraumatic.     Right Ear: Tympanic membrane and external ear normal.     Left Ear: External ear normal. There is impacted cerumen.     Nose: Nose normal.  Eyes:     Conjunctiva/sclera: Conjunctivae normal.  Neck:     Musculoskeletal: Neck supple.  Cardiovascular:     Rate and Rhythm: Normal rate.  Pulmonary:     Effort: Pulmonary effort is normal.  Skin:    General: Skin is warm and dry.     Capillary Refill: Capillary refill takes less than 2 seconds.  Neurological:     Mental Status: She is alert. Mental status is at  baseline.  Psychiatric:        Mood and Affect: Mood normal.        Behavior: Behavior normal.           Assessment & Plan:   Problem List Items Addressed This Visit    None    Visit Diagnoses    Impacted cerumen of left ear    -  Primary     Right ear with impacted cerumen - red/black in color. Attempted removal x 3 but unable to fully visualized the TM. Cerumen after attempted removal was soft and yellow in color. Attempted manual removal, but due to depth and already with some ear irritation from lavage decided to hold off.   Advised watch and wait. Could continue Debrox daily. If no improvement and still having pain/fullness after 1 week recommend call back and would do ENT referral given difficulty with removal.   Partially visualized TM w/o sign of infection.    Return if symptoms worsen or fail to improve.  Lesleigh Noe, MD

## 2019-05-24 NOTE — Patient Instructions (Signed)
#   Ear wax - we tried to get a lot out today - can get over the counter ear wax softener Debrox if you would like - if symptoms are not improved after 1 week then call back and we can refer you to ENT for further evaluation.

## 2019-08-09 ENCOUNTER — Ambulatory Visit: Payer: Medicare Other | Admitting: Family Medicine

## 2019-08-15 ENCOUNTER — Other Ambulatory Visit: Payer: Self-pay | Admitting: Internal Medicine

## 2019-08-24 ENCOUNTER — Ambulatory Visit: Payer: Medicare Other | Admitting: Internal Medicine

## 2019-10-12 ENCOUNTER — Telehealth: Payer: Self-pay | Admitting: Internal Medicine

## 2019-10-12 NOTE — Telephone Encounter (Signed)
Pt called wanting to do a virtual 12/10  Is it ok for 15 min??

## 2019-10-13 NOTE — Telephone Encounter (Signed)
Left message asking pt to call office  °

## 2019-10-13 NOTE — Telephone Encounter (Signed)
Yes. She needs to make sure to give [place] vital signs like blood pressure. She will still have to come to the office for labs. Dr Silvio Pate will have her arrange that after the appointment.

## 2019-10-13 NOTE — Telephone Encounter (Signed)
Patient said she doesn't have a way to check her blood pressure. She'll check her weight and temperature.  Patient said she received a stool card at her last visit, but it came back that she needed to do it again.  Patient is requesting a stool card be sent to her.

## 2019-10-14 ENCOUNTER — Ambulatory Visit (INDEPENDENT_AMBULATORY_CARE_PROVIDER_SITE_OTHER): Payer: Medicare Other | Admitting: Internal Medicine

## 2019-10-14 ENCOUNTER — Encounter: Payer: Self-pay | Admitting: Internal Medicine

## 2019-10-14 ENCOUNTER — Other Ambulatory Visit: Payer: Self-pay

## 2019-10-14 VITALS — Ht 63.0 in | Wt 238.0 lb

## 2019-10-14 DIAGNOSIS — E1149 Type 2 diabetes mellitus with other diabetic neurological complication: Secondary | ICD-10-CM

## 2019-10-14 DIAGNOSIS — M48061 Spinal stenosis, lumbar region without neurogenic claudication: Secondary | ICD-10-CM | POA: Diagnosis not present

## 2019-10-14 DIAGNOSIS — M5416 Radiculopathy, lumbar region: Secondary | ICD-10-CM | POA: Diagnosis not present

## 2019-10-14 MED ORDER — HYDROCODONE-ACETAMINOPHEN 5-325 MG PO TABS: 0.5000 | ORAL_TABLET | Freq: Two times a day (BID) | ORAL | 0 refills | Status: DC | PRN

## 2019-10-14 MED ORDER — GABAPENTIN 300 MG PO CAPS: 300.0000 mg | ORAL_CAPSULE | Freq: Every evening | ORAL | 1 refills | Status: DC | PRN

## 2019-10-14 NOTE — Assessment & Plan Note (Signed)
Control probably worse If much higher, will try increasing the metformin If mildly worse, will let her work on lifestyle (hopefully wont' be as stressed, etc) Gabapentin prn when neuropathy really affecting sleep

## 2019-10-14 NOTE — Assessment & Plan Note (Signed)
Uses the hydrocodone rarely Will refill today

## 2019-10-14 NOTE — Progress Notes (Signed)
Subjective:    Patient ID: Heather Shaffer, female    DOB: December 17, 1950, 68 y.o.   MRN: [id number]  HPI Video virtual visit for follow up of diabetes and other health conditions Identification done Reviewed billing and limitations and she gave consent Participants--- patient in her home and I am in my office  Doing well with COVID in general More concerned about her 74 year old mom Isolating and wearing mask Wouldn't come in here due to possible exposure   Checks sugars rarely 111 today fasting Probably higher at times Taking full metformin daily Still on gabapentin but uses prn. 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 Gabapentin makes her have to sleep for at least 12 hours after Rarely uses the hydrocodone  Current Outpatient Medications on File Prior to Visit  Medication Sig Dispense Refill  . gabapentin (NEURONTIN) 300 MG capsule Take 1 capsule (300 mg total) by mouth at bedtime as needed. 90 capsule 1  . glucose blood (ONE TOUCH ULTRA TEST) test strip Use to check blood sugar once a day Dx Code E11.49 100 each 3  . HYDROcodone-acetaminophen (NORCO/VICODIN) 5-325 MG tablet Take 0.5-1 tablets by mouth 2 (two) times daily as needed for moderate pain. 30 tablet 0  . metFORMIN (GLUCOPHAGE-XR) 500 MG 24 hr tablet TAKE 1 TABLET BY MOUTH  EVERY OTHER DAY 45 tablet 0  . ONETOUCH DELICA LANCETS 99991111 MISC 1 Units by Does not apply route daily. Use 1 a day to check blood sugar Dx Code E11.49 100 each 3  . triamcinolone cream (KENALOG) 0.1 % Apply 1 application topically 2 (two) times daily as needed. 45 g 1   No current facility-administered medications on file prior to visit.    Allergies  Allergen Reactions  . Bee Venom Swelling  . Sulfamethoxazole-Trimethoprim     REACTION: tongue swells  . Latex Rash  . Tdap [Tetanus-Diphth-Acell Pertussis] Rash    Past Medical History:  Diagnosis Date  . Allergy   . Back pain    Back injections at  [facility] for back pain &  spinal stenosis  . Barrett's esophagus   . Carpal tunnel syndrome   . Diabetes mellitus   . Diverticulitis   . DVT (deep venous thrombosis) (HCC)    hx of 1998 post knee replacement  . Hemorrhoids   . Hiatal hernia   . History of nephrolithiasis   . Osteoarthritis, knee   . Uterine fibroids affecting pregnancy     Past Surgical History:  Procedure Laterality Date  . LAPAROSCOPY    . TOTAL KNEE ARTHROPLASTY     right    Family History  Problem Relation Age of Onset  . Cancer Sister   . Diabetes Son   . Heart disease Son        cardiomyopathy    Social History   Socioeconomic History  . Marital status: Legally Separated    Spouse name: Not on file  . Number of children: 3  . Years of education: Not on file  . Highest education level: Not on file  Occupational History  . Occupation: Cabin crew, Art therapist    Comment: retired  . Occupation: [employer]    Comment: retired  Tobacco Use  . Smoking status: Former Research scientist (life sciences)  . Smokeless tobacco: Never Used  . Tobacco comment: quit 1975  Substance and Sexual Activity  . Alcohol use: No  . Drug use: No  . Sexual activity: Not Currently  Birth control/protection: Post-menopausal  Other Topics Concern  . Not on file  Social History Narrative   No living will   Daughter should be health care POA Arsenio Loader)   Would accept resuscitation   Would probably accept tube feeds   Social Determinants of Health   Financial Resource Strain:   . Difficulty of Paying Living Expenses: Not on file  Food Insecurity:   . Worried About Charity fundraiser in the Last Year: Not on file  . Ran Out of Food in the Last Year: Not on file  Transportation Needs:   . Lack of Transportation (Medical): Not on file  . Lack of Transportation (Non-Medical): Not on file  Physical Activity:   . Days of Exercise per Week: Not on file  . Minutes of Exercise per Session: Not on file  Stress:   . Feeling of  Stress : Not on file  Social Connections:   . Frequency of Communication with Friends and Family: Not on file  . Frequency of Social Gatherings with Friends and Family: Not on file  . Attends Religious Services: Not on file  . Active Member of Clubs or Organizations: Not on file  . Attends Archivist Meetings: Not on file  . Marital Status: Not on file  Intimate Partner Violence:   . Fear of Current or Ex-Partner: Not on file  . Emotionally Abused: Not on file  . Physically Abused: Not on file  . Sexually Abused: Not on file   Review of Systems Sleeps okay with gabapentin (12 hours) Balance off in am if she doesn't stay in bed that long after gabapentin Appetite is "ridiculous". Has been stress eating Weight is up some     Objective:   Physical Exam  Constitutional: She appears well-developed. No distress.  Respiratory: Effort normal. No respiratory distress.  Psychiatric: She has a normal mood and affect. Her behavior is normal.           Assessment & Plan:

## 2019-10-18 ENCOUNTER — Other Ambulatory Visit (INDEPENDENT_AMBULATORY_CARE_PROVIDER_SITE_OTHER): Payer: Medicare Other

## 2019-10-18 DIAGNOSIS — E1149 Type 2 diabetes mellitus with other diabetic neurological complication: Secondary | ICD-10-CM | POA: Diagnosis not present

## 2019-10-18 LAB — POCT GLYCOSYLATED HEMOGLOBIN (HGB A1C): Hemoglobin A1C: 7.4 % — AB (ref 4.0–5.6)

## 2019-10-19 ENCOUNTER — Other Ambulatory Visit: Payer: Self-pay | Admitting: Internal Medicine

## 2019-10-22 DIAGNOSIS — E119 Type 2 diabetes mellitus without complications: Secondary | ICD-10-CM | POA: Diagnosis not present

## 2020-02-27 ENCOUNTER — Other Ambulatory Visit: Payer: Self-pay | Admitting: Internal Medicine

## 2020-03-20 ENCOUNTER — Telehealth: Payer: Self-pay | Admitting: Internal Medicine

## 2020-03-20 NOTE — Progress Notes (Signed)
  Chronic Care Management   Note  03/20/2020 Name: Noellia Lopinto MRN: [id number] DOB: February 16, 1951  Aliece Vallejos is a 69 y.o. year old female who is a primary care patient of [place], Theophilus Kinds, MD. I reached out to Toy Baker by phone today in response to a referral sent by Ms. Isla Pence PCP, Venia Carbon, MD.   Ms. Alling was given information about Chronic Care Management services today including:  1. CCM service includes personalized support from designated clinical staff supervised by her physician, including individualized plan of care and coordination with other care providers 2. 24/7 contact phone numbers for assistance for urgent and routine care needs. 3. Service will only be billed when office clinical staff spend 20 minutes or more in a month to coordinate care. 4. Only one practitioner may furnish and bill the service in a calendar month. 5. The patient may stop CCM services at any time (effective at the end of the month) by phone call to the office staff.   Patient agreed to services and verbal consent obtained.   This note is not being shared with the patient for the following reason: To respect privacy (The patient or proxy has requested that the information not be shared).  Follow up plan:   Earney Hamburg Upstream Scheduler

## 2020-04-04 ENCOUNTER — Telehealth: Payer: Self-pay | Admitting: *Deleted

## 2020-04-04 NOTE — Telephone Encounter (Signed)
Patient called to schedule an appointment with Dr. Silvio Pate which was scheduled for tomorrow 04/05/20. Patient was transferred to triage because of her symptoms. Patient stated that she has had chest pain off and on for several months. Patient stated that she has some SOB with exertion. Patent stated that she has felt more confused than usual today. Patient stated that she was out in the yard yesterday and her blood sugar was 206, but had not eaten in 6 hours. Patient stated that she ate some orange slices and waited an hour then checked her blood sure and it was 275. Patient stated later she checked her blood sugar at 10:20 pm and it was 330. Patient stated that she takes Metformin every other day and did take it last night. Patent stated today at 8:20 her FBS was 192 and she ate some orange slices and cherries. Patent stated that at 9:17 am her blood sugar was 275.Marland Kitchen

## 2020-04-04 NOTE — Telephone Encounter (Signed)
[name] with [employer] left a voicemail stating that patient had reached out to them to find out what glucose monitor machine they would cover. [name] stated that proper documentation would need to be submitted in order for them to cover a glucose machine. [name] stated that the glucose machine would be provided [employer] because they would be the ones to supply the machine. Telephone number [phone].  See previous phone note on patient regarding her blood sugar readings. Patient stated that her machine had stopped working properly and would need to get a new machine. Patient has an appointment tomorrow with Dr. Silvio Pate. Patient was advised to contact her insurance company prior to her appointment and find out what machine they would cover.

## 2020-04-04 NOTE — Telephone Encounter (Signed)
But that still does not tell us what manufacturer the meter needs to be.

## 2020-04-04 NOTE — Telephone Encounter (Signed)
Spoke to [place] at [employer]. The pt has to have an account set up with [employer]. Covers OneTouch Verio. Pt will need to call that number in this message and set up acct. Then we can send the rx straight to them or they can send the request to [place]. I will advise pt at OV in the morning.

## 2020-04-04 NOTE — Telephone Encounter (Signed)
Have her increase the metformin to daily. If the pain changes or SOB worsens, she should proceed to ER---but otherwise I will see her tomorrow

## 2020-04-04 NOTE — Telephone Encounter (Signed)
Patient notified as instructed by telephone and verbalized understanding. Advised patient to rest and take it easy until her appointment tomorrow. Patient was given ER/911 precautions.

## 2020-04-05 ENCOUNTER — Other Ambulatory Visit: Payer: Self-pay

## 2020-04-05 ENCOUNTER — Ambulatory Visit (INDEPENDENT_AMBULATORY_CARE_PROVIDER_SITE_OTHER): Payer: Medicare Other | Admitting: Internal Medicine

## 2020-04-05 ENCOUNTER — Encounter: Payer: Self-pay | Admitting: Internal Medicine

## 2020-04-05 VITALS — BP 116/78 | HR 62 | Temp 97.7°F | Ht 63.0 in | Wt 237.0 lb

## 2020-04-05 DIAGNOSIS — E1165 Type 2 diabetes mellitus with hyperglycemia: Secondary | ICD-10-CM

## 2020-04-05 DIAGNOSIS — E1149 Type 2 diabetes mellitus with other diabetic neurological complication: Secondary | ICD-10-CM | POA: Diagnosis not present

## 2020-04-05 DIAGNOSIS — E1140 Type 2 diabetes mellitus with diabetic neuropathy, unspecified: Secondary | ICD-10-CM

## 2020-04-05 DIAGNOSIS — R079 Chest pain, unspecified: Secondary | ICD-10-CM | POA: Diagnosis not present

## 2020-04-05 DIAGNOSIS — IMO0002 Reserved for concepts with insufficient information to code with codable children: Secondary | ICD-10-CM | POA: Insufficient documentation

## 2020-04-05 LAB — COMPREHENSIVE METABOLIC PANEL
ALT: 14 U/L (ref 0–35)
AST: 18 U/L (ref 0–37)
Albumin: 4 g/dL (ref 3.5–5.2)
Alkaline Phosphatase: 106 U/L (ref 39–117)
BUN: 15 mg/dL (ref 6–23)
CO2: 32 mEq/L (ref 19–32)
Calcium: 9.9 mg/dL (ref 8.4–10.5)
Chloride: 101 mEq/L (ref 96–112)
Creatinine, Ser: 1.02 mg/dL (ref 0.40–1.20)
GFR: 64.91 mL/min (ref >60.00)
Glucose, Bld: 211 mg/dL — ABNORMAL HIGH (ref 70–99)
Potassium: 4.3 mEq/L (ref 3.5–5.1)
Sodium: 137 mEq/L (ref 135–145)
Total Bilirubin: 0.6 mg/dL (ref 0.2–1.2)
Total Protein: 7.5 g/dL (ref 6.0–8.3)

## 2020-04-05 LAB — LIPID PANEL
Cholesterol: 179 mg/dL (ref 0–200)
HDL: 65.5 mg/dL (ref >39.00)
LDL Cholesterol: 105 mg/dL — ABNORMAL HIGH (ref 0–99)
NonHDL: 113.65
Total CHOL/HDL Ratio: 3
Triglycerides: 41 mg/dL (ref 0.0–149.0)
VLDL: 8.2 mg/dL (ref 0.0–40.0)

## 2020-04-05 LAB — POCT GLYCOSYLATED HEMOGLOBIN (HGB A1C): Hemoglobin A1C: 9.1 % — AB (ref 4.0–5.6)

## 2020-04-05 LAB — T4, FREE: Free T4: 1.23 ng/dL (ref 0.60–1.60)

## 2020-04-05 LAB — CBC
HCT: 39.7 % (ref 36.0–46.0)
Hemoglobin: 13.1 g/dL (ref 12.0–15.0)
MCHC: 32.9 g/dL (ref 30.0–36.0)
MCV: 85.3 fl (ref 78.0–100.0)
Platelets: 204 10*3/uL (ref 150.0–400.0)
RBC: 4.66 Mil/uL (ref 3.87–5.11)
RDW: 15 % (ref 11.5–15.5)
WBC: 6.1 10*3/uL (ref 4.0–10.5)

## 2020-04-05 MED ORDER — ONETOUCH VERIO VI STRP: 1.0000 | ORAL_STRIP | 4 refills | Status: DC | PRN

## 2020-04-05 MED ORDER — ONETOUCH DELICA LANCETS 33G MISC: 1.0000 [IU] | Freq: Every day | 4 refills | Status: AC

## 2020-04-05 MED ORDER — ONETOUCH VERIO W/DEVICE KIT: 1.0000 | PACK | Freq: Once | 0 refills | Status: AC

## 2020-04-05 MED ORDER — DAPAGLIFLOZIN PROPANEDIOL 5 MG PO TABS: 5.0000 mg | ORAL_TABLET | Freq: Every day | ORAL | 3 refills | Status: DC

## 2020-04-05 NOTE — Assessment & Plan Note (Signed)
She now has symptoms from high sugars Not sure how long it has been up

## 2020-04-05 NOTE — Assessment & Plan Note (Signed)
Relates to anxiety and stress---but it certainly could be anginal Will start [place] for her diabetes--for heart effects as well Set up cardiology evaluation

## 2020-04-05 NOTE — Progress Notes (Signed)
Subjective:    Patient ID: Heather Shaffer, female    DOB: Mar 13, 1951, 69 y.o.   MRN: [id number]  HPI Here due to sugars going up high lately This visit occurred during the SARS-CoV-2 public health emergency.  Safety protocols were in place, including screening questions prior to the visit, additional usage of staff PPE, and extensive cleaning of exam room while observing appropriate contact time as indicated for disinfecting solutions.   They have been up "for a while" Now doesn't feel right Was up to 227 this morning Has been as high as 330--but after eating Has had over 200 fasting as well  Hasn't been checking sugars regularly till started feeling bad  Current Outpatient Medications on File Prior to Visit  Medication Sig Dispense Refill  . Blood Glucose Monitoring Suppl (ONETOUCH VERIO) w/Device KIT by Does not apply route.    . gabapentin (NEURONTIN) 300 MG capsule TAKE 1 CAPSULE BY MOUTH AT  BEDTIME AS NEEDED 90 capsule 3  . glucose blood (ONETOUCH VERIO) test strip 1 each by Other route as needed for other. Use as instructed    . metFORMIN (GLUCOPHAGE-XR) 500 MG 24 hr tablet TAKE 1 TABLET BY MOUTH  EVERY OTHER DAY (Patient taking differently: daily with breakfast. ) 45 tablet 3  . ONETOUCH DELICA LANCETS 00Q MISC 1 Units by Does not apply route daily. Use 1 a day to check blood sugar Dx Code E11.49 100 each 3   No current facility-administered medications on file prior to visit.    Allergies  Allergen Reactions  . Bee Venom Swelling  . Sulfamethoxazole-Trimethoprim     REACTION: tongue swells  . Latex Rash  . Tdap [Tetanus-Diphth-Acell Pertussis] Rash    Past Medical History:  Diagnosis Date  . Allergy   . Back pain    Back injections at [facility] for back pain &  spinal stenosis  . Barrett's esophagus   . Carpal tunnel syndrome   . Diabetes mellitus   . Diverticulitis   . DVT (deep venous thrombosis) (HCC)    hx of 1998 post knee replacement  . Hemorrhoids   .  Hiatal hernia   . History of nephrolithiasis   . Osteoarthritis, knee   . Uterine fibroids affecting pregnancy     Past Surgical History:  Procedure Laterality Date  . LAPAROSCOPY    . TOTAL KNEE ARTHROPLASTY     right    Family History  Problem Relation Age of Onset  . Cancer Sister   . Diabetes Son   . Heart disease Son        cardiomyopathy    Social History   Socioeconomic History  . Marital status: Legally Separated    Spouse name: Not on file  . Number of children: 3  . Years of education: Not on file  . Highest education level: Not on file  Occupational History  . Occupation: Cabin crew, Art therapist    Comment: retired  . Occupation: [employer]    Comment: retired  Tobacco Use  . Smoking status: Former Research scientist (life sciences)  . Smokeless tobacco: Never Used  . Tobacco comment: quit 1975  Substance and Sexual Activity  . Alcohol use: No  . Drug use: No  . Sexual activity: Not Currently    Birth control/protection: Post-menopausal  Other Topics Concern  . Not on file  Social History Narrative   No living will   Daughter should be health care POA Arsenio Loader)   Would accept resuscitation   Would probably accept  tube feeds   Social Determinants of Health   Financial Resource Strain:   . Difficulty of Paying Living Expenses:   Food Insecurity:   . Worried About Charity fundraiser in the Last Year:   . Arboriculturist in the Last Year:   Transportation Needs:   . Film/video editor (Medical):   Marland Kitchen Lack of Transportation (Non-Medical):   Physical Activity:   . Days of Exercise per Week:   . Minutes of Exercise per Session:   Stress:   . Feeling of Stress :   Social Connections:   . Frequency of Communication with Friends and Family:   . Frequency of Social Gatherings with Friends and Family:   . Attends Religious Services:   . Active Member of Clubs or Organizations:   . Attends Archivist Meetings:   Marland Kitchen Marital Status:   Intimate Partner  Violence:   . Fear of Current or Ex-Partner:   . Emotionally Abused:   Marland Kitchen Physically Abused:   . Sexually Abused:    Review of Systems  Has had some chest pain in past couple of months--relates to anxiety attacks Pandemic/personal problems Some DOE--like with housework     Objective:   Physical Exam  Constitutional: She appears well-developed. No distress.  Neck: No thyromegaly present.  Cardiovascular: Normal rate, regular rhythm and normal heart sounds. Exam reveals no gallop.  No murmur heard. Respiratory: Effort normal and breath sounds normal. No respiratory distress. She has no wheezes. She has no rales.  GI: Soft. There is no abdominal tenderness.  Musculoskeletal:        General: No edema.  Lymphadenopathy:    She has no cervical adenopathy.           Assessment & Plan:

## 2020-04-13 ENCOUNTER — Encounter: Payer: Self-pay | Admitting: Internal Medicine

## 2020-04-13 ENCOUNTER — Ambulatory Visit (INDEPENDENT_AMBULATORY_CARE_PROVIDER_SITE_OTHER): Payer: Medicare Other | Admitting: Internal Medicine

## 2020-04-13 ENCOUNTER — Other Ambulatory Visit: Payer: Self-pay

## 2020-04-13 VITALS — BP 122/82 | HR 73 | Temp 98.3°F | Ht 63.0 in | Wt 234.8 lb

## 2020-04-13 DIAGNOSIS — F39 Unspecified mood [affective] disorder: Secondary | ICD-10-CM | POA: Diagnosis not present

## 2020-04-13 DIAGNOSIS — Z1211 Encounter for screening for malignant neoplasm of colon: Secondary | ICD-10-CM

## 2020-04-13 DIAGNOSIS — Z7189 Other specified counseling: Secondary | ICD-10-CM | POA: Diagnosis not present

## 2020-04-13 DIAGNOSIS — IMO0002 Reserved for concepts with insufficient information to code with codable children: Secondary | ICD-10-CM

## 2020-04-13 DIAGNOSIS — E1140 Type 2 diabetes mellitus with diabetic neuropathy, unspecified: Secondary | ICD-10-CM | POA: Diagnosis not present

## 2020-04-13 DIAGNOSIS — E1165 Type 2 diabetes mellitus with hyperglycemia: Secondary | ICD-10-CM

## 2020-04-13 DIAGNOSIS — Z0000 Encounter for general adult medical examination without abnormal findings: Secondary | ICD-10-CM | POA: Diagnosis not present

## 2020-04-13 DIAGNOSIS — E11610 Type 2 diabetes mellitus with diabetic neuropathic arthropathy: Secondary | ICD-10-CM

## 2020-04-13 LAB — HM DIABETES FOOT EXAM

## 2020-04-13 MED ORDER — ATORVASTATIN CALCIUM 10 MG PO TABS: 10.0000 mg | ORAL_TABLET | ORAL | 3 refills | Status: DC

## 2020-04-13 NOTE — Assessment & Plan Note (Signed)
Will start the [place] and add to metformin Gabapentin when feet are bad at night Will start weekly statin

## 2020-04-13 NOTE — Assessment & Plan Note (Signed)
I have personally reviewed the Medicare Annual Wellness questionnaire and have noted 1. The patient's medical and social history 2. Their use of alcohol, tobacco or illicit drugs 3. Their current medications and supplements 4. The patient's functional ability including ADL's, fall risks, home safety risks and hearing or visual             impairment. 5. Diet and physical activities 6. Evidence for depression or mood disorders  The patients weight, height, BMI and visual acuity have been recorded in the chart I have made referrals, counseling and provided education to the patient based review of the above and I have provided the pt with a written personalized care plan for preventive services.  I have provided you with a copy of your personalized plan for preventive services. Please take the time to review along with your updated medication list.  Prefers no vaccines---but will take the COVID Mammogram due by end of this year---she can set up FIT Trying to stay active (handicapped permit done)

## 2020-04-13 NOTE — Assessment & Plan Note (Signed)
No other medications other than the gabapentin

## 2020-04-13 NOTE — Progress Notes (Signed)
Subjective:    Patient ID: Heather Shaffer, female    DOB: 1951/05/02, 69 y.o.   MRN: [id number]  HPI Here for Medicare wellness visit and follow up of chronic health conditions This visit occurred during the SARS-CoV-2 public health emergency.  Safety protocols were in place, including screening questions prior to the visit, additional usage of staff PPE, and extensive cleaning of exam room while observing appropriate contact time as indicated for disinfecting solutions.   Reviewed form and advanced directives Reviewed other doctors No alcohol or tobacco Tries to exercise doing yard work (with rolling chair) Vision and hearing okay--does have some left tinnitus at night No falls Has had some depression (sounds reactive) Independent with instrumental ADLs She has some memory issues  She got the [place] started  Will add to the metformin now Sugars some better even without it---down to 190  No recent chest pain--has cardiology appt next month No SOB  Left foot is numb Right one is still painful--Charcot joint, past surgery Balance is off due to this--gait is still troubled Back is better now---had Rx from [facility] that helped (she isn't sure if it was injection or what)  Has had some depression Not divorced Had to have protective order----he still thinks he was in the right She is still frightened of him--some flashbacks when she sees him Working through legal separation process (to finally file) Not anhedonic  Current Outpatient Medications on File Prior to Visit  Medication Sig Dispense Refill  . dapagliflozin propanediol (FARXIGA) 5 MG TABS tablet Take 1 tablet (5 mg total) by mouth daily. 90 tablet 3  . gabapentin (NEURONTIN) 300 MG capsule TAKE 1 CAPSULE BY MOUTH AT  BEDTIME AS NEEDED 90 capsule 3  . glucose blood (ONETOUCH VERIO) test strip 1 each by Other route as needed for other. Use to check blood sugar once a day. Dx Code E11.49 100 each 4  . metFORMIN  (GLUCOPHAGE-XR) 500 MG 24 hr tablet TAKE 1 TABLET BY MOUTH  EVERY OTHER DAY (Patient taking differently: daily with breakfast. ) 45 tablet 3  . OneTouch Delica Lancets 93O MISC 1 Units by Does not apply route daily. Use 1 a day to check blood sugar Dx Code E11.49 100 each 4   No current facility-administered medications on file prior to visit.    Allergies  Allergen Reactions  . Bee Venom Swelling  . Sulfamethoxazole-Trimethoprim     REACTION: tongue swells  . Latex Rash  . Tdap [Tetanus-Diphth-Acell Pertussis] Rash    Past Medical History:  Diagnosis Date  . Allergy   . Back pain    Back injections at [facility] for back pain &  spinal stenosis  . Barrett's esophagus   . Carpal tunnel syndrome   . Diabetes mellitus   . Diverticulitis   . DVT (deep venous thrombosis) (HCC)    hx of 1998 post knee replacement  . Hemorrhoids   . Hiatal hernia   . History of nephrolithiasis   . Osteoarthritis, knee   . Uterine fibroids affecting pregnancy     Past Surgical History:  Procedure Laterality Date  . LAPAROSCOPY    . TOTAL KNEE ARTHROPLASTY     right    Family History  Problem Relation Age of Onset  . Cancer Sister   . Diabetes Son   . Heart disease Son        cardiomyopathy    Social History   Socioeconomic History  . Marital status: Legally Separated    Spouse  name: Not on file  . Number of children: 3  . Years of education: Not on file  . Highest education level: Not on file  Occupational History  . Occupation: Cabin crew, Art therapist    Comment: retired  . Occupation: [employer]    Comment: retired  Tobacco Use  . Smoking status: Former Research scientist (life sciences)  . Smokeless tobacco: Never Used  . Tobacco comment: quit 1975  Substance and Sexual Activity  . Alcohol use: No  . Drug use: No  . Sexual activity: Not Currently    Birth control/protection: Post-menopausal  Other Topics Concern  . Not on file  Social History Narrative   No living will   Daughter should  be health care POA Arsenio Loader)   Would accept resuscitation   Would probably accept tube feeds   Social Determinants of Health   Financial Resource Strain:   . Difficulty of Paying Living Expenses:   Food Insecurity:   . Worried About Charity fundraiser in the Last Year:   . Arboriculturist in the Last Year:   Transportation Needs:   . Film/video editor (Medical):   Marland Kitchen Lack of Transportation (Non-Medical):   Physical Activity:   . Days of Exercise per Week:   . Minutes of Exercise per Session:   Stress:   . Feeling of Stress :   Social Connections:   . Frequency of Communication with Friends and Family:   . Frequency of Social Gatherings with Friends and Family:   . Attends Religious Services:   . Active Member of Clubs or Organizations:   . Attends Archivist Meetings:   Marland Kitchen Marital Status:   Intimate Partner Violence:   . Fear of Current or Ex-Partner:   . Emotionally Abused:   Marland Kitchen Physically Abused:   . Sexually Abused:    Review of Systems Appetite is good---"eating too much"---cutting down now Did gain some weight and getting it back down again Sleeps okay--does take gabapentin at night (but not every night) Wears seat belt Trouble with teeth --was due for implants (was seeing dentist in [place]---needs to reschedule) No heartburn or dysphagia Bowels are fine--no blood Gets a little dizzy at times if moves fast--no syncope     Objective:   Physical Exam  Constitutional: She is oriented to person, place, and time. No distress.  HENT:  Head: Normocephalic.  Mouth/Throat: Oropharynx is clear.  Cardiovascular: Normal rate and regular rhythm.  Faint right foot pulse, normal left foot pulse  Respiratory: Effort normal and breath sounds normal. She has no wheezes. She has no rales.  GI: Soft. There is no abdominal tenderness.  Musculoskeletal:     Cervical back: Neck supple.     Comments: Deformed right foot  Lymphadenopathy:    She has no  cervical adenopathy.  Neurological: She is alert and oriented to person, place, and time.  President--- "Zoila Shutter, Obama" [phone] D-[id number] Recall-- 2/3  Decreased sensation in feet  Skin:  No foot lesions  Psychiatric: Her behavior is normal. Mood normal.           Assessment & Plan:

## 2020-04-13 NOTE — Assessment & Plan Note (Signed)
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

## 2020-04-13 NOTE — Assessment & Plan Note (Signed)
See social history 

## 2020-04-14 LAB — MICROALBUMIN / CREATININE URINE RATIO
Creatinine,U: 111.5 mg/dL
Microalb Creat Ratio: 0.6 mg/g (ref 0.0–30.0)
Microalb, Ur: <0.7 mg/dL (ref 0.0–1.9)

## 2020-04-21 ENCOUNTER — Other Ambulatory Visit (INDEPENDENT_AMBULATORY_CARE_PROVIDER_SITE_OTHER): Payer: Medicare Other

## 2020-04-21 DIAGNOSIS — Z1211 Encounter for screening for malignant neoplasm of colon: Secondary | ICD-10-CM

## 2020-04-21 LAB — FECAL OCCULT BLOOD, IMMUNOCHEMICAL: Fecal Occult Bld: NEGATIVE

## 2020-05-04 ENCOUNTER — Telehealth: Payer: Medicare Other

## 2020-05-04 ENCOUNTER — Telehealth: Payer: Self-pay

## 2020-05-04 NOTE — Telephone Encounter (Signed)
  Chronic Care Management   Outreach Note  05/04/2020 Name: Heather Shaffer MRN: [id number] DOB: Aug 03, 1951  Referred by: Venia Carbon, MD  Attempted to reach patient by telephone on 05/04/20 at 3:00 PM for initial CCM visit. Left voicemail with contact information for rescheduling.  Debbora Dus, PharmD Clinical Pharmacist [facility] [phone]

## 2020-05-04 NOTE — Chronic Care Management (AMB) (Deleted)
Chronic Care Management Pharmacy  Name: Heather Shaffer  MRN: [id number] DOB: 1951-09-03  Chief Complaint/ HPI  Heather Shaffer,  69 y.o. , female presents for their Initial CCM visit with the clinical pharmacist via telephone.  PCP : Heather Carbon, MD  Their chronic conditions include: GERD, type 2 diabetes, peripheral neuropathy, osteoarthritis, mood disorder  Office Visits:  04/13/20: Heather Shaffer - AWV, has not started [place], start now; start weekly statin, cont. gabapentin PRN   04/05/20: Heather Shaffer - DM, start Farxiga, refer to cardiology for angina   04/04/20: coordinating new glucose monitor through [facility] Visit: none in past 6 months   Medications: Outpatient Encounter Medications as of 05/04/2020  Medication Sig  . atorvastatin (LIPITOR) 10 MG tablet Take 1 tablet (10 mg total) by mouth once a week.  . dapagliflozin propanediol (FARXIGA) 5 MG TABS tablet Take 1 tablet (5 mg total) by mouth daily.  Marland Kitchen gabapentin (NEURONTIN) 300 MG capsule TAKE 1 CAPSULE BY MOUTH AT  BEDTIME AS NEEDED  . glucose blood (ONETOUCH VERIO) test strip 1 each by Other route as needed for other. Use to check blood sugar once a day. Dx Code E11.49  . metFORMIN (GLUCOPHAGE-XR) 500 MG 24 hr tablet TAKE 1 TABLET BY MOUTH  EVERY OTHER DAY (Patient taking differently: daily with breakfast. )  . OneTouch Delica Lancets 25K MISC 1 Units by Does not apply route daily. Use 1 a day to check blood sugar Dx Code E11.49   No facility-administered encounter medications on file as of 05/04/2020.    Current Diagnosis/Assessment:  Goals Addressed   None     Diabetes   CMP Latest Ref Rng & Units 04/05/2020 02/16/2019 01/22/2018  Glucose 70 - 99 mg/dL 211(H) 137(H) 143(H)  BUN 6 - 23 mg/dL 15 8 15   Creatinine 0.40 - 1.20 mg/dL 1.02 1.12 1.03  Sodium 135 - 145 mEq/L 137 140 141  Potassium 3.5 - 5.1 mEq/L 4.3 4.4 4.4  Chloride 96 - 112 mEq/L 101 101 103  CO2 19 - 32 mEq/L 32 31 29  Calcium  8.4 - 10.5 mg/dL 9.9 10.4 10.4  Total Protein 6.0 - 8.3 g/dL 7.5 7.8 7.9  Total Bilirubin 0.2 - 1.2 mg/dL 0.6 0.4 0.5  Alkaline Phos 39 - 117 U/L 106 87 88  AST 0 - 37 U/L 18 27 18   ALT 0 - 35 U/L 14 19 11    Kidney Function Lab Results  Component Value Date/Time   CREATININE 1.02 04/05/2020 12:22 PM   CREATININE 1.12 02/16/2019 09:55 AM   CREATININE 0.97 10/09/2012 02:54 PM   GFR 64.91 04/05/2020 12:22 PM   GFRNONAA 58 (L) 02/08/2016 05:11 PM   GFRAA >60 02/08/2016 05:11 PM   K 4.3 04/05/2020 12:22 PM   K 4.4 02/16/2019 09:55 AM   Recent Relevant Labs: Lab Results  Component Value Date/Time   HGBA1C 9.1 (A) 04/05/2020 12:13 PM   HGBA1C 7.4 (A) 10/18/2019 01:57 PM   HGBA1C 7.5 (H) 02/16/2019 09:55 AM   HGBA1C 7.2 (H) 01/22/2018 02:58 PM   MICROALBUR <0.7 04/13/2020 04:11 PM   MICROALBUR 0.7 02/16/2019 09:55 AM    Checking BG: {CHL HP Blood Glucose Monitoring [phone]}  Recent FBG Readings: Recent pre-meal BG readings: *** Recent 2hr PP BG readings:  *** Recent HS BG readings: ***  Patient has failed these meds in past: *** Patient is currently {CHL Controlled/Uncontrolled:[phone]} on the following medications:  Farxiga 5 mg - 1 tablet daily (started 04/23/20)  Metformin 500 mg XR - 1 tablet every other day  Last diabetic eye exam:  Lab Results  Component Value Date/Time   HMDIABEYEEXA No Retinopathy 10/03/2017 12:00 AM    Last diabetic foot exam:  Lab Results  Component Value Date/Time   HMDIABFOOTEX done 04/13/2020 12:00 AM    We discussed: {CHL HP Upstream Pharmacy discussion:[phone]}  Plan: Continue {CHL HP Upstream Pharmacy Plans:[phone]}  Hyperlipidemia   Lipid Panel     Component Value Date/Time   CHOL 179 04/05/2020 1222   TRIG 41.0 04/05/2020 1222   HDL 65.50 04/05/2020 1222   LDLCALC 105 (H) 04/05/2020 1222   LDLDIRECT 126.9 06/17/2011 1304     LDL goal < 100 Patient has failed these meds in past: none  Patient is  currently {CHL Controlled/[phone]} on the following medications:  . Atorvastatin 10 mg - 1 tablet weekly (started 04/13/20)  We discussed:  {CHL HP Upstream Pharmacy discussion:[phone]}  Plan: Continue {CHL HP Upstream Pharmacy Plans:[phone]}  Peripheral Neuropathy   Patient has failed these meds in past:  Patient is currently {CHL Controlled/[phone]} on the following medications:   Gabapentin 300 mg - 1 capsule at bedtime PRN  We discussed:   Plan: Continue {CHL HP Upstream Pharmacy GQQPY:[id number]}   Medication Management  Pharmacy: [facility], [facility]   Adherence:  Social support:  Affordability:  CCM Follow Up:  Heather Shaffer, PharmD Clinical Pharmacist [facility] [phone]

## 2020-05-22 ENCOUNTER — Encounter: Payer: Self-pay | Admitting: Cardiovascular Disease

## 2020-05-22 NOTE — Progress Notes (Signed)
Cardiology Office Note:    Date:  05/23/2020   ID:  Heather Shaffer, DOB 07-17-1951, MRN [id number]  PCP:  Venia Carbon, MD  [facility] Cardiologist:  Mirian Casco   [facility] Electrophysiologist:  None   Referring MD: Venia Carbon, MD   Chief Complaint  Patient presents with  . Chest Pain    May 23, 2020   Heather Shaffer is a 69 y.o. female with a hx of chest pain . Hx of CP, moderate obesity . We are asked to see her by Dr Silvio Pate for further evaluation of her episodes of chest pains.  She thinks the CP is due to stress.   Is going through a separation after 40+ years of marriage.  Also looking after her 70 year old mother   CP is been going on for few months  CP is not exertional ,  Mostly occurred while sitting  CP was a mid sternal chest pressure.  .  Would last a few seconds but occurred in a repeating pattern. Not associated with dyspne, not assoc with diaphoresis .   Works in the yard.   Working in the yaard did not bring on the cp  Has gained weight over the year ( covid and other stresses. )   Raises flower and also some vegetables   Has not had any chest pain for the last week or so .   Father died of an MI during a heart cath .   She was told he died after an injection of the contrast.   Son had an MI  Mother has CAD    Past Medical History:  Diagnosis Date  . Allergy   . Back pain    Back injections at [facility] for back pain &  spinal stenosis  . Barrett's esophagus   . Carpal tunnel syndrome   . Diabetes mellitus   . Diverticulitis   . DVT (deep venous thrombosis) (HCC)    hx of 1998 post knee replacement  . Hemorrhoids   . Hiatal hernia   . History of nephrolithiasis   . Osteoarthritis, knee   . Uterine fibroids affecting pregnancy     Past Surgical History:  Procedure Laterality Date  . LAPAROSCOPY    . TOTAL KNEE ARTHROPLASTY     right    Current Medications: Current Meds  Medication Sig  . dapagliflozin propanediol  (FARXIGA) 5 MG TABS tablet Take 1 tablet (5 mg total) by mouth daily.  Marland Kitchen gabapentin (NEURONTIN) 300 MG capsule TAKE 1 CAPSULE BY MOUTH AT  BEDTIME AS NEEDED  . glucose blood (ONETOUCH VERIO) test strip 1 each by Other route as needed for other. Use to check blood sugar once a day. Dx Code E11.49  . metFORMIN (GLUCOPHAGE-XR) 500 MG 24 hr tablet Take 500 mg by mouth at bedtime.  [name] Delica Lancets 68T MISC 1 Units by Does not apply route daily. Use 1 a day to check blood sugar Dx Code E11.49     Allergies:   Bee venom, Sulfamethoxazole-trimethoprim, Latex, and Tdap [tetanus-diphth-acell pertussis]   Social History   Socioeconomic History  . Marital status: Legally Separated    Spouse name: Not on file  . Number of children: 3  . Years of education: Not on file  . Highest education level: Not on file  Occupational History  . Occupation: Cabin crew, Art therapist    Comment: retired  . Occupation: [employer]    Comment: retired  Tobacco Use  .  Smoking status: Former Research scientist (life sciences)  . Smokeless tobacco: Never Used  . Tobacco comment: quit 1975  Substance and Sexual Activity  . Alcohol use: No  . Drug use: No  . Sexual activity: Not Currently    Birth control/protection: Post-menopausal  Other Topics Concern  . Not on file  Social History Narrative   No living will   Daughter should be health care POA Arsenio Loader)   Would accept resuscitation   Would probably accept tube feeds   Social Determinants of Health   Financial Resource Strain:   . Difficulty of Paying Living Expenses:   Food Insecurity:   . Worried About Charity fundraiser in the Last Year:   . Arboriculturist in the Last Year:   Transportation Needs:   . Film/video editor (Medical):   Marland Kitchen Lack of Transportation (Non-Medical):   Physical Activity:   . Days of Exercise per Week:   . Minutes of Exercise per Session:   Stress:   . Feeling of Stress :   Social Connections:   . Frequency of  Communication with Friends and Family:   . Frequency of Social Gatherings with Friends and Family:   . Attends Religious Services:   . Active Member of Clubs or Organizations:   . Attends Archivist Meetings:   Marland Kitchen Marital Status:      Family History: The patient's family history includes Cancer in her sister; Diabetes in her son; Heart disease in her son.  ROS:   Please see the history of present illness.     All other systems reviewed and are negative.  EKGs/Labs/Other Studies Reviewed:    The following studies were reviewed today:   EKG:    July 20,2021.  Sinus bradycardia 59.  No ST or T wave changes.  Recent Labs: 04/05/2020: ALT 14; BUN 15; Creatinine, Ser 1.02; Hemoglobin 13.1; Platelets 204.0; Potassium 4.3; Sodium 137  Recent Lipid Panel    Component Value Date/Time   CHOL 179 04/05/2020 1222   TRIG 41.0 04/05/2020 1222   HDL 65.50 04/05/2020 1222   CHOLHDL 3 04/05/2020 1222   VLDL 8.2 04/05/2020 1222   LDLCALC 105 (H) 04/05/2020 1222   LDLDIRECT 126.9 06/17/2011 1304    Physical Exam:    VS:  BP 112/80   Pulse 60   Ht 5\' 3"  (1.6 m)   Wt 231 lb (104.8 kg)   SpO2 97%   BMI 40.92 kg/m     Wt Readings from Last 3 Encounters:  05/23/20 231 lb (104.8 kg)  04/13/20 234 lb 12.8 oz (106.5 kg)  04/05/20 237 lb (107.5 kg)     GEN:  Well nourished, well developed in no acute distress. Moderately obese HEENT: Normal NECK: No JVD; No carotid bruits LYMPHATICS: No lymphadenopathy CARDIAC: RRR, no murmurs, rubs, gallops RESPIRATORY:  Clear to auscultation without rales, wheezing or rhonchi  ABDOMEN: Soft, non-tender, non-distended MUSCULOSKELETAL:  No edema; No deformity  SKIN: Warm and dry NEUROLOGIC:  Alert and oriented x 3 PSYCHIATRIC:  Normal affect   ASSESSMENT:    1. Chest pain, unspecified type    PLAN:    In order of problems listed above:  1. CP -Heather Shaffer presents today for further evaluation of some chest pain.    These episodes are  fairly atypical for angina .  She does however however have history of diabetes mellitus and relatively strong family history of coronary disease.  We discussed doing a stress test.  She refuses to have  a stress test - she thinks the cp is due to stress from her separation from her husband.   At this point we will concentrate on risk factor modification.  Her LDL is 105.  Her primary medical doctor has called in a prescription for atorvastatin but I think that she will do better on rosuvastatin 10 mg a day.  We will send in a prescription for rosuvastatin and cancel the atorvastatin.  She will have her lipids, liver enzymes, basic metabolic profile drawn in approximately 3 months by her primary medical doctor.  I encouraged her to work on diet, exercise, weight loss.  2. Diabetes-she is becoming much more careful with her diet.  I encouraged her to stay away from things that are white, weak, sweet.  3.    Hyperlipidemia -her last LDL is 105.  I think that she will need more than low-dose atorvastatin.  Office called in a prescription for rosuvastatin 10 mg a day.  She will have her lipids checked at her primary medical doctor's office.  I will see her again in 6 months.  I advised her to call me if she has any worsening episodes of her chest pain.   Medication Adjustments/Labs and Tests Ordered: Current medicines are reviewed at length with the patient today.  Concerns regarding medicines are outlined above.  Orders Placed This Encounter  Procedures  . EKG 12-Lead   Meds ordered this encounter  Medications  . rosuvastatin (CRESTOR) 10 MG tablet    Sig: Take 1 tablet (10 mg total) by mouth daily.    Dispense:  90 tablet    Refill:  3    New RX.Marland Kitchen    Patient Instructions  Medication Instructions:  Your physician has recommended you make the following change in your medication: Start Rosuvastatin 10 mg a day   *If you need a refill on your cardiac medications before your next appointment,  please call your pharmacy*   Lab Work: none If you have labs (blood work) drawn today and your tests are completely normal, you will receive your results only by: Marland Kitchen MyChart Message (if you have MyChart) OR . A paper copy in the mail If you have any lab test that is abnormal or we need to change your treatment, we will call you to review the results.   Testing/Procedures: none   Follow-Up: At [facility], you and your health needs are our priority.  As part of our continuing mission to provide you with exceptional heart care, we have created designated Provider Care Teams.  These Care Teams include your primary Cardiologist (physician) and Advanced Practice Providers (APPs -  Physician Assistants and Nurse Practitioners) who all work together to provide you with the care you need, when you need it.  We recommend signing up for the patient portal called "MyChart".  Sign up information is provided on this After Visit Summary.  MyChart is used to connect with patients for Virtual Visits (Telemedicine).  Patients are able to view lab/test results, encounter notes, upcoming appointments, etc.  Non-urgent messages can be sent to your provider as well.   To learn more about what you can do with MyChart, go to [email or web address].    Your next appointment:   6 month(s)  The format for your next appointment:   In Person  Provider:   Mertie Moores, MD   Other Instructions      Signed, Mertie Moores, MD  05/23/2020 9:17 AM    [place]

## 2020-05-23 ENCOUNTER — Ambulatory Visit: Payer: Medicare Other | Admitting: Cardiovascular Disease

## 2020-05-23 ENCOUNTER — Other Ambulatory Visit: Payer: Self-pay

## 2020-05-23 ENCOUNTER — Encounter: Payer: Self-pay | Admitting: Cardiovascular Disease

## 2020-05-23 VITALS — BP 112/80 | HR 60 | Ht 63.0 in | Wt 231.0 lb

## 2020-05-23 DIAGNOSIS — E1149 Type 2 diabetes mellitus with other diabetic neurological complication: Secondary | ICD-10-CM

## 2020-05-23 DIAGNOSIS — E782 Mixed hyperlipidemia: Secondary | ICD-10-CM | POA: Diagnosis not present

## 2020-05-23 DIAGNOSIS — E785 Hyperlipidemia, unspecified: Secondary | ICD-10-CM | POA: Insufficient documentation

## 2020-05-23 DIAGNOSIS — R079 Chest pain, unspecified: Secondary | ICD-10-CM

## 2020-05-23 DIAGNOSIS — E1169 Type 2 diabetes mellitus with other specified complication: Secondary | ICD-10-CM | POA: Insufficient documentation

## 2020-05-23 MED ORDER — ROSUVASTATIN CALCIUM 10 MG PO TABS
10.0000 mg | ORAL_TABLET | Freq: Every day | ORAL | 3 refills | Status: DC
Start: 2020-05-23 — End: 2021-01-16

## 2020-05-23 NOTE — Patient Instructions (Signed)
Medication Instructions:  Your physician has recommended you make the following change in your medication: Start Rosuvastatin 10 mg a day   *If you need a refill on your cardiac medications before your next appointment, please call your pharmacy*   Lab Work: none If you have labs (blood work) drawn today and your tests are completely normal, you will receive your results only by: Marland Kitchen MyChart Message (if you have MyChart) OR . A paper copy in the mail If you have any lab test that is abnormal or we need to change your treatment, we will call you to review the results.   Testing/Procedures: none   Follow-Up: At [facility], you and your health needs are our priority.  As part of our continuing mission to provide you with exceptional heart care, we have created designated Provider Care Teams.  These Care Teams include your primary Cardiologist (physician) and Advanced Practice Providers (APPs -  Physician Assistants and Nurse Practitioners) who all work together to provide you with the care you need, when you need it.  We recommend signing up for the patient portal called "MyChart".  Sign up information is provided on this After Visit Summary.  MyChart is used to connect with patients for Virtual Visits (Telemedicine).  Patients are able to view lab/test results, encounter notes, upcoming appointments, etc.  Non-urgent messages can be sent to your provider as well.   To learn more about what you can do with MyChart, go to [email or web address].    Your next appointment:   6 month(s)  The format for your next appointment:   In Person  Provider:   Mertie Moores, MD   Other Instructions

## 2020-07-14 ENCOUNTER — Encounter: Payer: Self-pay | Admitting: Internal Medicine

## 2020-07-14 ENCOUNTER — Ambulatory Visit (INDEPENDENT_AMBULATORY_CARE_PROVIDER_SITE_OTHER): Payer: Medicare Other | Admitting: Internal Medicine

## 2020-07-14 ENCOUNTER — Other Ambulatory Visit: Payer: Self-pay

## 2020-07-14 VITALS — BP 112/78 | HR 62 | Temp 97.3°F | Ht 63.0 in | Wt 225.0 lb

## 2020-07-14 DIAGNOSIS — F39 Unspecified mood [affective] disorder: Secondary | ICD-10-CM

## 2020-07-14 DIAGNOSIS — E1165 Type 2 diabetes mellitus with hyperglycemia: Secondary | ICD-10-CM | POA: Diagnosis not present

## 2020-07-14 DIAGNOSIS — E1140 Type 2 diabetes mellitus with diabetic neuropathy, unspecified: Secondary | ICD-10-CM | POA: Diagnosis not present

## 2020-07-14 DIAGNOSIS — E785 Hyperlipidemia, unspecified: Secondary | ICD-10-CM

## 2020-07-14 DIAGNOSIS — IMO0002 Reserved for concepts with insufficient information to code with codable children: Secondary | ICD-10-CM

## 2020-07-14 LAB — POCT GLYCOSYLATED HEMOGLOBIN (HGB A1C): Hemoglobin A1C: 6.7 % — AB (ref 4.0–5.6)

## 2020-07-14 NOTE — Assessment & Plan Note (Signed)
Lab Results  Component Value Date   HGBA1C 6.7 (A) 07/14/2020   Markedly better---much due to improved eating and weight loss Will continue the farxiga and metformin

## 2020-07-14 NOTE — Progress Notes (Signed)
Subjective:    Patient ID: Heather Shaffer, female    DOB: 1951/02/24, 69 y.o.   MRN: [id number]  HPI Here for follow up of diabetes and chest pain This visit occurred during the SARS-CoV-2 public health emergency.  Safety protocols were in place, including screening questions prior to the visit, additional usage of staff PPE, and extensive cleaning of exam room while observing appropriate contact time as indicated for disinfecting solutions.   Feels that things are better  Checking sugars every day Variable 120-192----but usually under 150 Is taking the farxiga every day in addition to metformin Has been careful to avoid foods that raise her sugar Same foot numbness---no pain  Still gets occasional chest pain Relates mostly to stress About one per month---usually at rest No SOB  Mood is okay Limited contact with husband--he occasionally comes by (like to cut grass) Tries to avoid him  Current Outpatient Medications on File Prior to Visit  Medication Sig Dispense Refill  . dapagliflozin propanediol (FARXIGA) 5 MG TABS tablet Take 1 tablet (5 mg total) by mouth daily. 90 tablet 3  . gabapentin (NEURONTIN) 300 MG capsule TAKE 1 CAPSULE BY MOUTH AT  BEDTIME AS NEEDED 90 capsule 3  . glucose blood (ONETOUCH VERIO) test strip 1 each by Other route as needed for other. Use to check blood sugar once a day. Dx Code E11.49 100 each 4  . metFORMIN (GLUCOPHAGE-XR) 500 MG 24 hr tablet Take 500 mg by mouth at bedtime.    [name] Delica Lancets 26R MISC 1 Units by Does not apply route daily. Use 1 a day to check blood sugar Dx Code E11.49 100 each 4  . rosuvastatin (CRESTOR) 10 MG tablet Take 1 tablet (10 mg total) by mouth daily. 90 tablet 3   No current facility-administered medications on file prior to visit.    Allergies  Allergen Reactions  . Bee Venom Swelling  . Sulfamethoxazole-Trimethoprim     REACTION: tongue swells  . Latex Rash  . Tdap [Tetanus-Diphth-Acell Pertussis]  Rash    Past Medical History:  Diagnosis Date  . Allergy   . Back pain    Back injections at [facility] for back pain &  spinal stenosis  . Barrett's esophagus   . Carpal tunnel syndrome   . Diabetes mellitus   . Diverticulitis   . DVT (deep venous thrombosis) (HCC)    hx of 1998 post knee replacement  . Hemorrhoids   . Hiatal hernia   . History of nephrolithiasis   . Osteoarthritis, knee   . Uterine fibroids affecting pregnancy     Past Surgical History:  Procedure Laterality Date  . LAPAROSCOPY    . TOTAL KNEE ARTHROPLASTY     right    Family History  Problem Relation Age of Onset  . Cancer Sister   . Diabetes Son   . Heart disease Son        cardiomyopathy    Social History   Socioeconomic History  . Marital status: Legally Separated    Spouse name: Not on file  . Number of children: 3  . Years of education: Not on file  . Highest education level: Not on file  Occupational History  . Occupation: Cabin crew, Art therapist    Comment: retired  . Occupation: [employer]    Comment: retired  Tobacco Use  . Smoking status: Former Research scientist (life sciences)  . Smokeless tobacco: Never Used  . Tobacco comment: quit 1975  Substance and Sexual Activity  .  Alcohol use: No  . Drug use: No  . Sexual activity: Not Currently    Birth control/protection: Post-menopausal  Other Topics Concern  . Not on file  Social History Narrative   No living will   Daughter should be health care POA Arsenio Loader)   Would accept resuscitation   Would probably accept tube feeds   Social Determinants of Health   Financial Resource Strain:   . Difficulty of Paying Living Expenses: Not on file  Food Insecurity:   . Worried About Charity fundraiser in the Last Year: Not on file  . Ran Out of Food in the Last Year: Not on file  Transportation Needs:   . Lack of Transportation (Medical): Not on file  . Lack of Transportation (Non-Medical): Not on file  Physical Activity:   . Days of Exercise  per Week: Not on file  . Minutes of Exercise per Session: Not on file  Stress:   . Feeling of Stress : Not on file  Social Connections:   . Frequency of Communication with Friends and Family: Not on file  . Frequency of Social Gatherings with Friends and Family: Not on file  . Attends Religious Services: Not on file  . Active Member of Clubs or Organizations: Not on file  . Attends Archivist Meetings: Not on file  . Marital Status: Not on file  Intimate Partner Violence:   . Fear of Current or Ex-Partner: Not on file  . Emotionally Abused: Not on file  . Physically Abused: Not on file  . Sexually Abused: Not on file   Review of Systems Has lost 9#--working on healthier eating Sleeps okay with the gabapentin (doesn't take it every night--it makes her sleep for 12 hours). Uses melatonin at times Now on rosuvastatin    Objective:   Physical Exam Cardiovascular:     Rate and Rhythm: Normal rate and regular rhythm.     Pulses: Normal pulses.     Heart sounds: No murmur heard.  No gallop.   Pulmonary:     Effort: Pulmonary effort is normal.     Breath sounds: Normal breath sounds. No wheezing or rales.  Musculoskeletal:     Right lower leg: No edema.     Left lower leg: No edema.  Skin:    Comments: No foot lesions  Neurological:     Mental Status: She is alert.  Psychiatric:        Mood and Affect: Mood normal.        Behavior: Behavior normal.            Assessment & Plan:

## 2020-07-14 NOTE — Assessment & Plan Note (Signed)
Still occasional  Mostly with stress Didn't want cardiac work up Now on high potency statin---crestor
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
## 2020-09-05 MED ORDER — METFORMIN HCL ER 500 MG PO TB24: 500.0000 mg | ORAL_TABLET | Freq: Every day | ORAL | 0 refills | Status: DC

## 2020-10-26 ENCOUNTER — Other Ambulatory Visit: Payer: Self-pay | Admitting: Internal Medicine

## 2020-10-30 ENCOUNTER — Other Ambulatory Visit: Payer: Self-pay | Admitting: Internal Medicine

## 2020-10-31 DIAGNOSIS — E119 Type 2 diabetes mellitus without complications: Secondary | ICD-10-CM | POA: Diagnosis not present

## 2021-01-16 ENCOUNTER — Ambulatory Visit (INDEPENDENT_AMBULATORY_CARE_PROVIDER_SITE_OTHER): Payer: Medicare Other | Admitting: Internal Medicine

## 2021-01-16 ENCOUNTER — Encounter: Payer: Self-pay | Admitting: Internal Medicine

## 2021-01-16 ENCOUNTER — Other Ambulatory Visit: Payer: Self-pay

## 2021-01-16 VITALS — BP 132/76 | HR 75 | Temp 97.6°F | Ht 63.0 in | Wt 199.0 lb

## 2021-01-16 DIAGNOSIS — E1165 Type 2 diabetes mellitus with hyperglycemia: Secondary | ICD-10-CM | POA: Diagnosis not present

## 2021-01-16 DIAGNOSIS — E1140 Type 2 diabetes mellitus with diabetic neuropathy, unspecified: Secondary | ICD-10-CM | POA: Diagnosis not present

## 2021-01-16 DIAGNOSIS — IMO0002 Reserved for concepts with insufficient information to code with codable children: Secondary | ICD-10-CM

## 2021-01-16 DIAGNOSIS — E1149 Type 2 diabetes mellitus with other diabetic neurological complication: Secondary | ICD-10-CM | POA: Diagnosis not present

## 2021-01-16 LAB — POCT GLYCOSYLATED HEMOGLOBIN (HGB A1C): Hemoglobin A1C: 6.2 % — AB (ref 4.0–5.6)

## 2021-01-16 NOTE — Progress Notes (Signed)
Subjective:    Patient ID: Heather Shaffer, female    DOB: September 06, 1951, 70 y.o.   MRN: [id number]  HPI Here for follow up of diabetes This visit occurred during the SARS-CoV-2 public health emergency.  Safety protocols were in place, including screening questions prior to the visit, additional usage of staff PPE, and extensive cleaning of exam room while observing appropriate contact time as indicated for disinfecting solutions.   She really changed her diet---lost 25# She didn't continue the farxiga after the initial Rx Just on the metformin though Checks sugars daily--- 118-125 Mild numbness-mostly in hands No pain there  Occasional chest pain---stress related Hasn't been back to Dr Acie Fredrickson No SOB  Wasn't comfortable with starting the rosuvastatin Discussed trying once a week  Current Outpatient Medications on File Prior to Visit  Medication Sig Dispense Refill  . gabapentin (NEURONTIN) 300 MG capsule TAKE 1 CAPSULE BY MOUTH AT  BEDTIME AS NEEDED 90 capsule 3  . glucose blood (ONETOUCH VERIO) test strip 1 each by Other route as needed for other. Use to check blood sugar once a day. Dx Code E11.49 100 each 4  . metFORMIN (GLUCOPHAGE-XR) 500 MG 24 hr tablet TAKE 1 TABLET BY MOUTH AT BEDTIME. 30 tablet 11  . OneTouch Delica Lancets 54Y MISC 1 Units by Does not apply route daily. Use 1 a day to check blood sugar Dx Code E11.49 100 each 4   No current facility-administered medications on file prior to visit.    Allergies  Allergen Reactions  . Bee Venom Swelling  . Sulfamethoxazole-Trimethoprim     REACTION: tongue swells  . Latex Rash  . Tdap [Tetanus-Diphth-Acell Pertussis] Rash    Past Medical History:  Diagnosis Date  . Allergy   . Back pain    Back injections at [facility] for back pain &  spinal stenosis  . Barrett's esophagus   . Carpal tunnel syndrome   . Diabetes mellitus   . Diverticulitis   . DVT (deep venous thrombosis) (HCC)    hx of 1998 post knee replacement   . Hemorrhoids   . Hiatal hernia   . History of nephrolithiasis   . Osteoarthritis, knee   . Uterine fibroids affecting pregnancy     Past Surgical History:  Procedure Laterality Date  . LAPAROSCOPY    . TOTAL KNEE ARTHROPLASTY     right    Family History  Problem Relation Age of Onset  . Cancer Sister   . Diabetes Son   . Heart disease Son        cardiomyopathy    Social History   Socioeconomic History  . Marital status: Legally Separated    Spouse name: Not on file  . Number of children: 3  . Years of education: Not on file  . Highest education level: Not on file  Occupational History  . Occupation: Cabin crew, Art therapist    Comment: retired  . Occupation: [employer]    Comment: retired  Tobacco Use  . Smoking status: Former Research scientist (life sciences)  . Smokeless tobacco: Never Used  . Tobacco comment: quit 1975  Substance and Sexual Activity  . Alcohol use: No  . Drug use: No  . Sexual activity: Not Currently    Birth control/protection: Post-menopausal  Other Topics Concern  . Not on file  Social History Narrative   No living will   Daughter should be health care POA Arsenio Loader)   Would accept resuscitation   Would probably accept tube feeds   Social  Determinants of Health   Financial Resource Strain: Not on file  Food Insecurity: Not on file  Transportation Needs: Not on file  Physical Activity: Not on file  Stress: Not on file  Social Connections: Not on file  Intimate Partner Violence: Not on file   Review of [place] on left shoulder a couple of months ago--carrying a load of wood Still able to use it--but it hurts    Objective:   Physical Exam Constitutional:      Appearance: Normal appearance.  Cardiovascular:     Rate and Rhythm: Normal rate and regular rhythm.     Heart sounds: No murmur heard. No gallop.      Comments: Faint pulse right foot, 1+ in left Pulmonary:     Effort: Pulmonary effort is normal.     Breath sounds: Normal  breath sounds. No wheezing or rales.  Musculoskeletal:     Cervical back: Neck supple.     Right lower leg: No edema.     Left lower leg: No edema.  Lymphadenopathy:     Cervical: No cervical adenopathy.  Skin:    Comments: No foot lesions  Neurological:     Mental Status: She is alert.            Assessment & Plan:

## 2021-01-16 NOTE — Assessment & Plan Note (Signed)
Lab Results  Component Value Date   HGBA1C 6.2 (A) 01/16/2021   Much better Lost 25# Now just on the metformin Mild neuropathy--on gabapentin Urged her to try the statin--at least once a week

## 2021-01-16 NOTE — Patient Instructions (Signed)
Please try the rosuvastatin once a week.

## 2021-01-23 MED ORDER — ONETOUCH VERIO VI STRP: 1.0000 | ORAL_STRIP | 4 refills | Status: DC | PRN

## 2021-03-20 ENCOUNTER — Other Ambulatory Visit: Payer: Self-pay | Admitting: Internal Medicine

## 2021-06-04 DIAGNOSIS — Z1231 Encounter for screening mammogram for malignant neoplasm of breast: Secondary | ICD-10-CM | POA: Diagnosis not present

## 2021-06-04 DIAGNOSIS — N8110 Cystocele, unspecified: Secondary | ICD-10-CM | POA: Diagnosis not present

## 2021-06-04 LAB — HM MAMMOGRAPHY: HM Mammogram: NORMAL (ref 0–4)

## 2021-06-14 DIAGNOSIS — Z1211 Encounter for screening for malignant neoplasm of colon: Secondary | ICD-10-CM | POA: Diagnosis not present

## 2021-06-14 LAB — COLOGUARD: Cologuard: POSITIVE — AB

## 2021-06-21 LAB — COLOGUARD: COLOGUARD: POSITIVE — AB

## 2021-07-04 ENCOUNTER — Telehealth: Payer: Self-pay | Admitting: Internal Medicine

## 2021-07-04 NOTE — Telephone Encounter (Signed)
Got positive Cologuard at gyn. Sent to me---I called her about it She needs colonoscopy but she doesn't know where she wants to go--may want to go to [facility].  I urged her to decide ASAP and send my [place] email and then I can make referral. She has appt in 3 weeks also

## 2021-07-05 ENCOUNTER — Telehealth: Payer: Self-pay | Admitting: Internal Medicine

## 2021-07-05 DIAGNOSIS — R195 Other fecal abnormalities: Secondary | ICD-10-CM

## 2021-07-05 NOTE — Telephone Encounter (Signed)
Heather Shaffer called in wanted to know about getting a referral for colonoscopy, and it needs to go to Dr.Omobonike Lafe Garin,  [place]   [place] NUMBER  [phone]

## 2021-07-05 NOTE — Telephone Encounter (Signed)
Please let her know that I have put in the referral--but it may take some time to get an appt

## 2021-07-05 NOTE — Telephone Encounter (Signed)
Returning phone call to Heather Shaffer

## 2021-07-05 NOTE — Telephone Encounter (Signed)
Tried calling patient and she did not answer however I did leave a VM for her to call back.

## 2021-07-06 NOTE — Telephone Encounter (Signed)
Called the patient and informed her about the referral, she stated her understanding and appreciation. Patient's mychart message was completed with this encounter.

## 2021-07-11 ENCOUNTER — Encounter: Payer: Self-pay | Admitting: Pharmacist

## 2021-07-11 NOTE — Progress Notes (Signed)
[facility])                                            [place] Team                                        Statin Quality Measure Assessment    07/11/2021  Heather Shaffer Mar 25, 1951 [id number]  Per review of chart and payor information, patient has a diagnosis of diabetes but is not currently filling a statin prescription.  This places patient into the SUPD (Statin Use In Patients with Diabetes) measure for CMS.    PharmD reviewed past fill history, patient had prescription for atorvastatin 10 mg sent to [place] on 04/13/2020, confirmed with [facility] that patient did not pick the medication up.   Rosuvastatin 10 mg was filled on 05/23/2020 and picked up for a 90 day supply.   Patient will need a new prescription for statin medication once weekly sent to preferred pharmacy and filled for the 2022 year.     The 10-year ASCVD risk score Mikey Bussing DC Brooke Bonito., et al., 2013) is: 22%   Values used to calculate the score:     Age: 70 years     Sex: Female     Is Non-Hispanic African American: Yes     Diabetic: Yes     Tobacco smoker: No     Systolic Blood Pressure: 123XX123 mmHg     Is BP treated: No     HDL Cholesterol: 65.5 mg/dL     Total Cholesterol: 179 mg/dL 04/05/2020     Component Value Date/Time   CHOL 179 04/05/2020 1222   TRIG 41.0 04/05/2020 1222   HDL 65.50 04/05/2020 1222   CHOLHDL 3 04/05/2020 1222   VLDL 8.2 04/05/2020 1222   LDLCALC 105 (H) 04/05/2020 1222   LDLDIRECT 126.9 06/17/2011 1304    Recommendation:  New prescription for low to moderate intensity statin medication prescribed as taking will need to be re-issued to patients preferred pharmacy.   1x weekly qty: 13 rfs: 3 2x weekly qty: 26 rfs: 3 3x weekly qty: 39 rfs: 3  Loretha Brasil, PharmD [place] Clinical Pharmacist Direct Dial: [phone]

## 2021-07-20 ENCOUNTER — Encounter: Payer: Medicare Other | Admitting: Internal Medicine

## 2021-07-25 ENCOUNTER — Ambulatory Visit (INDEPENDENT_AMBULATORY_CARE_PROVIDER_SITE_OTHER): Payer: Medicare Other | Admitting: Internal Medicine

## 2021-07-25 ENCOUNTER — Encounter: Payer: Self-pay | Admitting: Internal Medicine

## 2021-07-25 ENCOUNTER — Other Ambulatory Visit: Payer: Self-pay

## 2021-07-25 VITALS — BP 132/84 | HR 64 | Temp 97.6°F | Ht 62.5 in | Wt 196.0 lb

## 2021-07-25 DIAGNOSIS — Z7189 Other specified counseling: Secondary | ICD-10-CM

## 2021-07-25 DIAGNOSIS — E11610 Type 2 diabetes mellitus with diabetic neuropathic arthropathy: Secondary | ICD-10-CM

## 2021-07-25 DIAGNOSIS — E1149 Type 2 diabetes mellitus with other diabetic neurological complication: Secondary | ICD-10-CM

## 2021-07-25 DIAGNOSIS — Z23 Encounter for immunization: Secondary | ICD-10-CM | POA: Diagnosis not present

## 2021-07-25 DIAGNOSIS — F39 Unspecified mood [affective] disorder: Secondary | ICD-10-CM

## 2021-07-25 DIAGNOSIS — Z0000 Encounter for general adult medical examination without abnormal findings: Secondary | ICD-10-CM

## 2021-07-25 DIAGNOSIS — R079 Chest pain, unspecified: Secondary | ICD-10-CM

## 2021-07-25 LAB — HEMOGLOBIN A1C: Hgb A1c MFr Bld: 6.5 % (ref 4.6–6.5)

## 2021-07-25 LAB — CBC
HCT: 39.7 % (ref 36.0–46.0)
Hemoglobin: 12.6 g/dL (ref 12.0–15.0)
MCHC: 31.9 g/dL (ref 30.0–36.0)
MCV: 85.3 fl (ref 78.0–100.0)
Platelets: 208 10*3/uL (ref 150.0–400.0)
RBC: 4.65 Mil/uL (ref 3.87–5.11)
RDW: 15.3 % (ref 11.5–15.5)
WBC: 4.3 10*3/uL (ref 4.0–10.5)

## 2021-07-25 LAB — COMPREHENSIVE METABOLIC PANEL
ALT: 13 U/L (ref 0–35)
AST: 18 U/L (ref 0–37)
Albumin: 4.1 g/dL (ref 3.5–5.2)
Alkaline Phosphatase: 78 U/L (ref 39–117)
BUN: 18 mg/dL (ref 6–23)
CO2: 33 mEq/L — ABNORMAL HIGH (ref 19–32)
Calcium: 10.2 mg/dL (ref 8.4–10.5)
Chloride: 104 mEq/L (ref 96–112)
Creatinine, Ser: 1.06 mg/dL (ref 0.40–1.20)
GFR: 53.1 mL/min — ABNORMAL LOW (ref >60.00)
Glucose, Bld: 119 mg/dL — ABNORMAL HIGH (ref 70–99)
Potassium: 4.5 mEq/L (ref 3.5–5.1)
Sodium: 142 mEq/L (ref 135–145)
Total Bilirubin: 0.5 mg/dL (ref 0.2–1.2)
Total Protein: 7.2 g/dL (ref 6.0–8.3)

## 2021-07-25 LAB — LIPID PANEL
Cholesterol: 200 mg/dL (ref 0–200)
HDL: 74.8 mg/dL (ref >39.00)
LDL Cholesterol: 117 mg/dL — ABNORMAL HIGH (ref 0–99)
NonHDL: 124.83
Total CHOL/HDL Ratio: 3
Triglycerides: 37 mg/dL (ref 0.0–149.0)
VLDL: 7.4 mg/dL (ref 0.0–40.0)

## 2021-07-25 LAB — HM DIABETES FOOT EXAM

## 2021-07-25 MED ORDER — ROSUVASTATIN CALCIUM 10 MG PO TABS: 10.0000 mg | ORAL_TABLET | ORAL | 3 refills | Status: DC

## 2021-07-25 NOTE — Addendum Note (Signed)
Addended by: Pilar Grammes on: 07/25/2021 02:55 PM   Modules accepted: Orders

## 2021-07-25 NOTE — Assessment & Plan Note (Signed)
Seems to be stress related Going back to cardiology

## 2021-07-25 NOTE — Progress Notes (Signed)
Subjective:    Patient ID: Heather Shaffer, female    DOB: [birth year], 70 y.o.   MRN: [id number]  HPI Here for Medicare wellness visit and follow up of chronic health conditions This visit occurred during the SARS-CoV-2 public health emergency.  Safety protocols were in place, including screening questions prior to the visit, additional usage of staff PPE, and extensive cleaning of exam room while observing appropriate contact time as indicated for disinfecting solutions.   Reviewed advanced directives Reviewed other doctors--Dr Nahser--cardiology, [facility] Dr Ferdinand Cava No hospitalizations or surgery this year Vision is okay--yearly exam in December Hearing okay 1 fall this year--no sig injury Mild mood issues No alcohol or tobacco Does yard work but no set exercise Independent with instrumental ADLs Some concern about her memory--mostly recall or forgetting where she put stuff (but no functional issues)  Got scheduled for colonoscopy with provider that was out of network at 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 Dx Code E11.49 100 strip 3   metFORMIN (GLUCOPHAGE-XR) 500 MG 24 hr tablet TAKE 1 TABLET BY MOUTH AT BEDTIME. 30 tablet  11   OneTouch Delica Lancets 94R MISC 1 Units by Does not apply route daily. Use 1 a day to check blood sugar Dx Code E11.49 100 each 4   No current facility-administered medications on file prior to visit.    Allergies  Allergen Reactions   Bee Venom Swelling   Sulfamethoxazole-Trimethoprim     REACTION: tongue swells   Latex Rash   Tdap [Tetanus-Diphth-Acell Pertussis] Rash    Past Medical History:  Diagnosis Date   Allergy    Back pain    Back injections at [facility] for back pain &  spinal stenosis   Barrett's esophagus    Carpal tunnel syndrome    Diabetes mellitus    Diverticulitis    DVT (deep venous thrombosis) (HCC)    hx of 1998 post knee replacement   Hemorrhoids    Hiatal hernia    History of nephrolithiasis    Osteoarthritis, knee    Uterine fibroids affecting pregnancy     Past Surgical History:  Procedure Laterality Date   LAPAROSCOPY     TOTAL KNEE ARTHROPLASTY     right    Family History  Problem Relation Age of Onset   Cancer Sister    Diabetes Son    Heart disease Son        cardiomyopathy    Social History   Socioeconomic History   Marital status: Legally Separated    Spouse name: Not on file   Number of children: 3   Years of education: Not on file   Highest education level: Not on file  Occupational History   Occupation: Cabin crew, Art therapist  Comment: retired   Occupation: [employer]    Comment: retired  Tobacco Use   Smoking status: Former   Smokeless tobacco: Never   Tobacco comments:    quit 1975  Substance and Sexual Activity   Alcohol use: No   Drug use: No   Sexual activity: Not Currently    Birth control/protection: Post-menopausal  Other Topics Concern   Not on file  Social History Narrative   No living will   Daughter should be health care POA Arsenio Loader)   Would accept resuscitation   Would probably accept tube feeds   Social Determinants of Health   Financial Resource Strain: Not on file  Food  Insecurity: Not on file  Transportation Needs: Not on file  Physical Activity: Not on file  Stress: Not on file  Social Connections: Not on file  Intimate Partner Violence: Not on file   Review of Systems Appetite is good Weight down slightly Sleeps okay Had been due for dental implants---delayed due to COVID No suspicious skin lesions Wears seat belt No heartburn or dysphagia Bowels move fine--no blood Voids okay---no dysuria or incontinence Back pain is better. Still some knee pain--uses vicks vaporub only      Objective:   Physical Exam Constitutional:      Appearance: Normal appearance.  HENT:     Mouth/Throat:     Comments: No lesions Eyes:     Conjunctiva/sclera: Conjunctivae normal.     Pupils: Pupils are equal, round, and reactive to light.  Cardiovascular:     Rate and Rhythm: Normal rate and regular rhythm.     Heart sounds: No murmur heard.   No gallop.     Comments: Faint pulse left foot---absent right Pulmonary:     Effort: Pulmonary effort is normal.     Breath sounds: No wheezing or rales.     Comments: Decreased breath sounds but clear Abdominal:     Palpations: Abdomen is soft.     Tenderness: There is no abdominal tenderness.  Musculoskeletal:     Cervical back: Neck supple.  Lymphadenopathy:     Cervical: No cervical adenopathy.  Skin:    Findings: No lesion or rash.     Comments: Charcot abnormality right foot No ulcers  Neurological:     Mental Status: She is alert and oriented to person, place, and time.     Comments: President---"Biden, Trump, Obama" [id number] D-l-[id number]-w Recall 3/3  Mildly decreased sensation in feet  Psychiatric:        Mood and Affect: Mood normal.        Behavior: Behavior normal.           Assessment & Plan:

## 2021-07-25 NOTE — Assessment & Plan Note (Signed)
Still seems to be well controlled on metformin Will check labs Off gabapentin now She again agrees to try weekly statin

## 2021-07-25 NOTE — Assessment & Plan Note (Signed)
See social history 

## 2021-07-25 NOTE — Assessment & Plan Note (Signed)
Only occasional pain so not using meds

## 2021-07-25 NOTE — Assessment & Plan Note (Signed)
I have personally reviewed the Medicare Annual Wellness questionnaire and have noted 1. The patient's medical and social history 2. Their use of alcohol, tobacco or illicit drugs 3. Their current medications and supplements 4. The patient's functional ability including ADL's, fall risks, home safety risks and hearing or visual             impairment. 5. Diet and physical activities 6. Evidence for depression or mood disorders  The patients weight, height, BMI and visual acuity have been recorded in the chart I have made referrals, counseling and provided education to the patient based review of the above and I have provided the pt with a written personalized care plan for preventive services.  I have provided you with a copy of your personalized plan for preventive services. Please take the time to review along with your updated medication list.  Will give flu vaccine today Bivalent COVID vaccine at pharmacy Td if any injury Needs colonoscopy --she is working on this Recent mammogram Discussed exercise

## 2021-07-25 NOTE — Assessment & Plan Note (Signed)
Ongoing stress with husband who won't agree to separation, etc No meds indicated

## 2021-09-17 NOTE — Progress Notes (Signed)
BiRads 2

## 2021-10-03 DIAGNOSIS — E1142 Type 2 diabetes mellitus with diabetic polyneuropathy: Secondary | ICD-10-CM | POA: Diagnosis not present

## 2021-10-03 DIAGNOSIS — D122 Benign neoplasm of ascending colon: Secondary | ICD-10-CM | POA: Diagnosis not present

## 2021-10-03 DIAGNOSIS — K5730 Diverticulosis of large intestine without perforation or abscess without bleeding: Secondary | ICD-10-CM | POA: Diagnosis not present

## 2021-10-03 DIAGNOSIS — M1990 Unspecified osteoarthritis, unspecified site: Secondary | ICD-10-CM | POA: Diagnosis not present

## 2021-10-03 DIAGNOSIS — R195 Other fecal abnormalities: Secondary | ICD-10-CM | POA: Diagnosis not present

## 2021-10-03 DIAGNOSIS — K648 Other hemorrhoids: Secondary | ICD-10-CM | POA: Diagnosis not present

## 2021-10-03 DIAGNOSIS — Z7984 Long term (current) use of oral hypoglycemic drugs: Secondary | ICD-10-CM | POA: Diagnosis not present

## 2021-10-03 DIAGNOSIS — K219 Gastro-esophageal reflux disease without esophagitis: Secondary | ICD-10-CM | POA: Diagnosis not present

## 2021-10-03 DIAGNOSIS — D126 Benign neoplasm of colon, unspecified: Secondary | ICD-10-CM | POA: Diagnosis not present

## 2021-10-03 DIAGNOSIS — D120 Benign neoplasm of cecum: Secondary | ICD-10-CM | POA: Diagnosis not present

## 2021-10-03 DIAGNOSIS — E785 Hyperlipidemia, unspecified: Secondary | ICD-10-CM | POA: Diagnosis not present

## 2021-10-03 DIAGNOSIS — Z86010 Personal history of colonic polyps: Secondary | ICD-10-CM | POA: Diagnosis not present

## 2021-10-03 DIAGNOSIS — M5416 Radiculopathy, lumbar region: Secondary | ICD-10-CM | POA: Diagnosis not present

## 2021-10-03 DIAGNOSIS — Z882 Allergy status to sulfonamides status: Secondary | ICD-10-CM | POA: Diagnosis not present

## 2021-10-03 DIAGNOSIS — Z91040 Latex allergy status: Secondary | ICD-10-CM | POA: Diagnosis not present

## 2021-10-03 DIAGNOSIS — Z86718 Personal history of other venous thrombosis and embolism: Secondary | ICD-10-CM | POA: Diagnosis not present

## 2021-10-03 DIAGNOSIS — K6389 Other specified diseases of intestine: Secondary | ICD-10-CM | POA: Diagnosis not present

## 2021-10-03 DIAGNOSIS — Z1211 Encounter for screening for malignant neoplasm of colon: Secondary | ICD-10-CM | POA: Diagnosis not present

## 2021-10-03 DIAGNOSIS — K635 Polyp of colon: Secondary | ICD-10-CM | POA: Diagnosis not present

## 2021-10-03 LAB — HM COLONOSCOPY

## 2021-10-29 ENCOUNTER — Other Ambulatory Visit: Payer: Self-pay | Admitting: Internal Medicine

## 2021-11-01 DIAGNOSIS — E119 Type 2 diabetes mellitus without complications: Secondary | ICD-10-CM | POA: Diagnosis not present

## 2021-11-01 LAB — HM DIABETES EYE EXAM

## 2022-01-22 ENCOUNTER — Other Ambulatory Visit: Payer: Self-pay

## 2022-01-22 ENCOUNTER — Encounter: Payer: Self-pay | Admitting: Internal Medicine

## 2022-01-22 ENCOUNTER — Ambulatory Visit (INDEPENDENT_AMBULATORY_CARE_PROVIDER_SITE_OTHER): Payer: Medicare Other | Admitting: Internal Medicine

## 2022-01-22 VITALS — BP 118/68 | HR 55 | Temp 97.9°F | Ht 62.5 in | Wt 183.0 lb

## 2022-01-22 DIAGNOSIS — E1122 Type 2 diabetes mellitus with diabetic chronic kidney disease: Secondary | ICD-10-CM

## 2022-01-22 DIAGNOSIS — E1149 Type 2 diabetes mellitus with other diabetic neurological complication: Secondary | ICD-10-CM | POA: Diagnosis not present

## 2022-01-22 DIAGNOSIS — N1831 Chronic kidney disease, stage 3a: Secondary | ICD-10-CM | POA: Diagnosis not present

## 2022-01-22 DIAGNOSIS — N183 Chronic kidney disease, stage 3 unspecified: Secondary | ICD-10-CM | POA: Insufficient documentation

## 2022-01-22 LAB — POCT GLYCOSYLATED HEMOGLOBIN (HGB A1C): Hemoglobin A1C: 6.1 % — AB (ref 4.0–5.6)

## 2022-01-22 LAB — MICROALBUMIN / CREATININE URINE RATIO
Creatinine,U: 44.9 mg/dL
Microalb Creat Ratio: 1.6 mg/g (ref 0.0–30.0)
Microalb, Ur: <0.7 mg/dL (ref 0.0–1.9)

## 2022-01-22 NOTE — Progress Notes (Signed)
? ?Subjective:  ? ? Patient ID: Tyshauna Finkbiner, female    DOB: [birth year], 71 y.o.   MRN: [id number] ? ?HPI ?Here for follow up of diabetes ? ?Doing okay ?Has been trying to lose weight---works a lot in yard ?Careful with how she is eating ?Has lost another 13#  ?Has numbness in hands---CTS. No sig problems with feet ? ?Mood okay ?Still able to avoid her ex ? ?Last GFR 53 ? ?Current Outpatient Medications on File Prior to Visit  ?Medication Sig Dispense Refill  ? glucose blood (ONETOUCH VERIO) test strip Use to check blood sugar once daily. Dx Code E11.49 100 strip 3  ? metFORMIN (GLUCOPHAGE-XR) 500 MG 24 hr tablet TAKE 1 TABLET BY MOUTH EVERYDAY AT BEDTIME 90 tablet 3  ? OneTouch Delica Lancets 14G MISC 1 Units by Does not apply route daily. Use 1 a day to check blood sugar Dx Code E11.49 100 each 4  ? rosuvastatin (CRESTOR) 10 MG tablet Take 1 tablet (10 mg total) by mouth once a week. 13 tablet 3  ? ?No current facility-administered medications on file prior to visit.  ? ? ?Allergies  ?Allergen Reactions  ? Bee Venom Swelling  ? Sulfamethoxazole-Trimethoprim   ?  REACTION: tongue swells  ? Latex Rash  ? Tdap [Tetanus-Diphth-Acell Pertussis] Rash  ? ? ?Past Medical History:  ?Diagnosis Date  ? Allergy   ? Back pain   ? Back injections at [facility] for back pain &  spinal stenosis  ? Barrett's esophagus   ? Carpal tunnel syndrome   ? Diabetes mellitus   ? Diverticulitis   ? DVT (deep venous thrombosis) ([place])   ? hx of 1998 post knee replacement  ? Hemorrhoids   ? Hiatal hernia   ? History of nephrolithiasis   ? Osteoarthritis, knee   ? Uterine fibroids affecting pregnancy   ? ? ?Past Surgical History:  ?Procedure Laterality Date  ? LAPAROSCOPY    ? TOTAL KNEE ARTHROPLASTY    ? right  ? ? ?Family History  ?Problem Relation Age of Onset  ? Cancer Sister   ? Diabetes Son   ? Heart disease Son   ?     cardiomyopathy  ? ? ?Social History  ? ?Socioeconomic History  ? Marital status: Legally Separated  ?  Spouse name: Not on  file  ? Number of children: 3  ? Years of education: Not on file  ? Highest education level: Not on file  ?Occupational History  ? 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 Occupation: [employer]  ?  Comment: retired  ?Tobacco Use  ? Smoking status: Former  ?  Passive exposure: Never  ? Smokeless tobacco: Never  ? Tobacco comments:  ?  quit 1975  ?Substance and Sexual Activity  ? Alcohol use: No  ? Drug use: No  ? Sexual activity: Not Currently  ?  Birth control/protection: Post-menopausal  ?Other Topics Concern  ? Not on file  ?Social History Narrative  ? No living will  ? Daughter should be health care POA Arsenio Loader)  ? Would accept resuscitation  ? Would probably accept tube feeds  ? ?Social Determinants of Health  ? ?Financial Resource Strain: Not on file  ?Food Insecurity: Not on file  ?Transportation Needs: Not on file  ?Physical Activity: Not on file  ?Stress: Not on file  ?Social Connections: Not on file  ?Intimate Partner Violence: Not on file  ? ? ?Review of Systems ?Sleeps fair--variable ?Appetite  is good ?   ?Objective:  ? Physical Exam ?Constitutional:   ?   Appearance: Normal appearance.  ?Cardiovascular:  ?   Rate and Rhythm: Normal rate and regular rhythm.  ?   Comments: Faint pedal pulses ?Pulmonary:  ?   Effort: Pulmonary effort is normal.  ?   Breath sounds: Normal breath sounds. No wheezing or rales.  ?Musculoskeletal:  ?   Cervical back: Neck supple.  ?   Right lower leg: No edema.  ?   Left lower leg: No edema.  ?Lymphadenopathy:  ?   Cervical: No cervical adenopathy.  ?Skin: ?   Comments: No foot lesions  ?Neurological:  ?   Mental Status: She is alert.  ?  ? ? ? ? ?   ?Assessment & Plan:  ? ?

## 2022-01-22 NOTE — Addendum Note (Signed)
Addended by: Pilar Grammes on: 01/22/2022 11:51 AM ? ? Modules accepted: Orders ? ?

## 2022-01-22 NOTE — Assessment & Plan Note (Signed)
Lab Results  ?Component Value Date  ? HGBA1C 6.1 (A) 01/22/2022  ? ?Excellent control on metformin '500mg'$  ?Is on statin ?Very mild neurologic symptoms  ?

## 2022-01-22 NOTE — Assessment & Plan Note (Signed)
Mild ?BP low--so will avoid ACEI/ARB ?Discussed farxiga/jardiance or referral ?She is okay with holding off on all ?Will plan to recheck at next visit ?

## 2022-03-17 ENCOUNTER — Other Ambulatory Visit: Payer: Self-pay | Admitting: Internal Medicine

## 2022-04-04 ENCOUNTER — Telehealth: Payer: Self-pay | Admitting: Internal Medicine

## 2022-04-04 NOTE — Telephone Encounter (Signed)
Type of forms received: disability   Routed [id number]  Paperwork received by : terrill   Individual made aware of 3-5 business day turn around (Y/N):y  Form completed and patient made aware of charges(Y/N): y  Faxed to :   Form location:  dr Silvio Pate box

## 2022-04-04 NOTE — Telephone Encounter (Signed)
Form placed in Dr Letvak's inbox on his desk 

## 2022-04-05 NOTE — Telephone Encounter (Signed)
Left message for pt to let her know the form is up front ready for pickup

## 2022-04-10 ENCOUNTER — Ambulatory Visit (INDEPENDENT_AMBULATORY_CARE_PROVIDER_SITE_OTHER): Payer: Medicare Other | Admitting: Family Medicine

## 2022-04-10 ENCOUNTER — Encounter: Payer: Self-pay | Admitting: Family Medicine

## 2022-04-10 VITALS — BP 118/70 | HR 64 | Temp 97.2°F | Ht 62.5 in | Wt 179.2 lb

## 2022-04-10 DIAGNOSIS — M7989 Other specified soft tissue disorders: Secondary | ICD-10-CM | POA: Insufficient documentation

## 2022-04-10 DIAGNOSIS — G5603 Carpal tunnel syndrome, bilateral upper limbs: Secondary | ICD-10-CM | POA: Diagnosis not present

## 2022-04-10 LAB — BASIC METABOLIC PANEL
BUN: 25 mg/dL — ABNORMAL HIGH (ref 6–23)
CO2: 31 mEq/L (ref 19–32)
Calcium: 9.7 mg/dL (ref 8.4–10.5)
Chloride: 102 mEq/L (ref 96–112)
Creatinine, Ser: 1.03 mg/dL (ref 0.40–1.20)
GFR: 54.68 mL/min — ABNORMAL LOW (ref >60.00)
Glucose, Bld: 107 mg/dL — ABNORMAL HIGH (ref 70–99)
Potassium: 4.3 mEq/L (ref 3.5–5.1)
Sodium: 142 mEq/L (ref 135–145)

## 2022-04-10 LAB — CBC WITH DIFFERENTIAL/PLATELET
Basophils Absolute: 0 10*3/uL (ref 0.0–0.1)
Basophils Relative: 0.2 % (ref 0.0–3.0)
Eosinophils Absolute: 0.3 10*3/uL (ref 0.0–0.7)
Eosinophils Relative: 6.1 % — ABNORMAL HIGH (ref 0.0–5.0)
HCT: 39.1 % (ref 36.0–46.0)
Hemoglobin: 12.6 g/dL (ref 12.0–15.0)
Lymphocytes Relative: 30.5 % (ref 12.0–46.0)
Lymphs Abs: 1.5 10*3/uL (ref 0.7–4.0)
MCHC: 32.3 g/dL (ref 30.0–36.0)
MCV: 86.6 fl (ref 78.0–100.0)
Monocytes Absolute: 0.4 10*3/uL (ref 0.1–1.0)
Monocytes Relative: 8.9 % (ref 3.0–12.0)
Neutro Abs: 2.7 10*3/uL (ref 1.4–7.7)
Neutrophils Relative %: 54.3 % (ref 43.0–77.0)
Platelets: 197 10*3/uL (ref 150.0–400.0)
RBC: 4.52 Mil/uL (ref 3.87–5.11)
RDW: 15.7 % — ABNORMAL HIGH (ref 11.5–15.5)
WBC: 4.9 10*3/uL (ref 4.0–10.5)

## 2022-04-10 LAB — TSH: TSH: 0.62 u[IU]/mL (ref 0.35–5.50)

## 2022-04-10 LAB — SEDIMENTATION RATE: Sed Rate: 21 mm/hr (ref 0–30)

## 2022-04-10 LAB — VITAMIN B12: Vitamin B-12: >1504 pg/mL — ABNORMAL HIGH (ref 211–911)

## 2022-04-10 NOTE — Progress Notes (Signed)
Patient ID: Heather Shaffer, female    DOB: May 14, 1951, 71 y.o.   MRN: [id number]  This visit was conducted in person.  BP 118/70   Pulse 64   Temp (!) 97.2 F (36.2 C) (Temporal)   Ht 5' 2.5" (1.588 m)   Wt 179 lb 4 oz (81.3 kg)   SpO2 100%   BMI 32.26 kg/m    CC: R hand swelling with bilateral numbness Subjective:   HPI: Heather Shaffer is a 71 y.o. female presenting on 04/10/2022 for Hand Swelling (C/o R hand swelling, bilateral hand numbness and nail discoloration. Started yesterday. H/o same sxs. )   Patient of Dr Alla German new to me presents with sudden onset R hand swelling when she woke up this morning. Both hands hurt at night time, describes throbbing ache, dangling hand to side of bed helps.  This is second time this has happened to right hand - within the past 3 months.  Denies inciting trauma/injury or falls  Notes worsening tingling and numbness to bilateral entire hands.  She does try to use brace at night time.  No h/o gout.  No fevers/chills, skin rash, nausea, diarrhea or constipation.  No other joint pains.  No foot neuropathy symptoms.  Intentional weight loss.   She regularly takes B complex vitamin as well as other supplements (med list updated today).  No new vitamins, supplements or OTC meds.  She stopped Crestor.  Has h/o bilateral CTS.  States had colonoscopy 09/2021 ([facility]) - 2 precancerous polyps removed.      Relevant past medical, surgical, family and social history reviewed and updated as indicated. Interim medical history since our last visit reviewed. Allergies and medications reviewed and updated. Outpatient Medications Prior to Visit  Medication Sig Dispense Refill   Apple Cider Vinegar 600 MG CAPS Take by mouth daily.     B Complex-C (SUPER B COMPLEX PO) Take by mouth daily. Plus electrolytes     Biotin 5000 MCG CAPS Take by mouth daily.     Cholecalciferol (VITAMIN D3) 25 MCG (1000 UT) CAPS Take by mouth daily.     CINNAMON PO  Take 1,000 mg by mouth daily.     Cyanocobalamin (VITAMIN B-12) 5000 MCG SUBL Place under the tongue daily.     glucose blood (ONETOUCH VERIO) test strip USE TO CHECK BLOOD SUGAR ONCE A DAY. DX CODE E11.49 25 strip 19   metFORMIN (GLUCOPHAGE-XR) 500 MG 24 hr tablet TAKE 1 TABLET BY MOUTH EVERYDAY AT BEDTIME 90 tablet 3   MILK THISTLE PO Take 1,000 mg by mouth daily.     Multiple Vitamins-Minerals (HAIR/SKIN/NAILS/BIOTIN PO) Take by mouth daily. Extra strength     Multiple Vitamins-Minerals (MULTIVITAMIN ADULTS 50+) TABS Take by mouth daily.     OneTouch Delica Lancets 62I MISC 1 Units by Does not apply route daily. Use 1 a day to check blood sugar Dx Code E11.49 100 each 4   vitamin E 180 MG (400 UNITS) capsule Take 400 Units by mouth daily.     rosuvastatin (CRESTOR) 10 MG tablet Take 1 tablet (10 mg total) by mouth once a week. 13 tablet 3   No facility-administered medications prior to visit.     Per HPI unless specifically indicated in ROS section below Review of Systems  Objective:  BP 118/70   Pulse 64   Temp (!) 97.2 F (36.2 C) (Temporal)   Ht 5' 2.5" (1.588 m)   Wt 179 lb 4 oz (81.3 kg)  SpO2 100%   BMI 32.26 kg/m   Wt Readings from Last 3 Encounters:  04/10/22 179 lb 4 oz (81.3 kg)  01/22/22 183 lb (83 kg)  07/25/21 196 lb (88.9 kg)      Physical Exam Vitals and nursing note reviewed.  Constitutional:      Appearance: Normal appearance. She is not ill-appearing.  Musculoskeletal:        General: Swelling present.     Comments:  Isolated right hand swelling throughout 2+ radial pulses bilaterally No pain with palpation of any hand joints bilaterally No erythema or warmth to the hands.  Skin:    General: Skin is warm and dry.     Capillary Refill: Capillary refill takes less than 2 seconds.     Findings: No rash.     Comments: Marked pallor of lunula with mild erythema of distal nailbeds, all fingernails affected  Neurological:     Mental Status: She is  alert.     Sensory: Sensory deficit (R>L hand diminished sensation to light touch) present.     Motor: No weakness or atrophy.     Comments:  Grip strength intact bilaterally Negative Phalen test Positive Tinel bilaterally  Psychiatric:        Mood and Affect: Mood normal.        Behavior: Behavior normal.      Results for orders placed or performed in visit on 01/22/22  Microalbumin/Creatinine Ratio, Urine  Result Value Ref Range   Microalb, Ur <0.7 0.0 - 1.9 mg/dL   Creatinine,U 44.9 mg/dL   Microalb Creat Ratio 1.6 0.0 - 30.0 mg/g  HgB A1c  Result Value Ref Range   Hemoglobin A1C 6.1 (A) 4.0 - 5.6 %   HbA1c POC (<> result, manual entry)     HbA1c, POC (prediabetic range)     HbA1c, POC (controlled diabetic range)      Assessment & Plan:   Problem List Items Addressed This Visit     Carpal tunnel syndrome    Possibly contributing to right hand swelling. Will refer to [place] hand surgeon per patient request.       Relevant Orders   Vitamin B12   Ambulatory referral to Hand Surgery   Swelling of right hand - Primary    Marked unilateral swelling of right hand that developed overnight associated with bilateral hand paresthesias, numbness and throbbing ache, suspect carpal tunnel syndrome largely contributing.  I wonder if her hands are being compressed overnight while asleep and due to to numbness she does not realize this so by the time she wakes up she has developed marked edema of the hand. Recommend wrist brace overnight, right-sided brace provided today.  We will further evaluate with lab work today as well as recommend evaluation by hand surgeon- she desires to go to [facility], will let me know contact information to place referral.       Relevant Orders   Vitamin B12   Sedimentation rate   TSH   CBC with Differential/Platelet   Basic metabolic panel   Ambulatory referral to Hand Surgery     No orders of the defined types were placed in this  encounter.  Orders Placed This Encounter  Procedures   Vitamin B12   Sedimentation rate   TSH   CBC with Differential/Platelet   Basic metabolic panel   Ambulatory referral to Hand Surgery    Referral Priority:   Routine    Referral Type:   Surgical    Referral  Reason:   Specialty Services Required    Requested Specialty:   Hand Surgery    Number of Visits Requested:   1     Patient Instructions  Labs today  We will refer you to hand doctor for further evaluation of carpal tunnel syndrome possibly contributing to hand swelling.  Continue wrist brace at night time (right side brace provided today). Let us know if worsening symptoms.    Follow up plan: Return if symptoms worsen or fail to improve.  Ria Bush, MD

## 2022-04-10 NOTE — Assessment & Plan Note (Signed)
Possibly contributing to right hand swelling. Will refer to [facility] hand surgeon per patient request.

## 2022-04-10 NOTE — Assessment & Plan Note (Signed)
Marked unilateral swelling of right hand that developed overnight associated with bilateral hand paresthesias, numbness and throbbing ache, suspect carpal tunnel syndrome largely contributing.  I wonder if her hands are being compressed overnight while asleep and due to to numbness she does not realize this so by the time she wakes up she has developed marked edema of the hand. Recommend wrist brace overnight, right-sided brace provided today.  We will further evaluate with lab work today as well as recommend evaluation by hand surgeon- she desires to go to [facility], will let me know contact information to place referral.

## 2022-04-10 NOTE — Patient Instructions (Addendum)
Labs today  We will refer you to hand doctor for further evaluation of carpal tunnel syndrome possibly contributing to hand swelling.  Continue wrist brace at night time (right side brace provided today). Let us know if worsening symptoms.

## 2022-04-11 ENCOUNTER — Encounter: Payer: Self-pay | Admitting: Family Medicine

## 2022-04-30 ENCOUNTER — Ambulatory Visit: Payer: Medicare Other | Admitting: Cardiovascular Disease

## 2022-04-30 ENCOUNTER — Encounter: Payer: Self-pay | Admitting: Cardiovascular Disease

## 2022-04-30 VITALS — BP 138/70 | HR 58 | Ht 62.5 in | Wt 182.0 lb

## 2022-04-30 DIAGNOSIS — E1149 Type 2 diabetes mellitus with other diabetic neurological complication: Secondary | ICD-10-CM | POA: Diagnosis not present

## 2022-04-30 DIAGNOSIS — R079 Chest pain, unspecified: Secondary | ICD-10-CM | POA: Diagnosis not present

## 2022-04-30 NOTE — Progress Notes (Signed)
Cardiology Office Note:    Date:  04/30/2022   ID:  Heather Shaffer, DOB Oct 25, 1951, MRN [id number]  PCP:  Karie Schwalbe, MD  [facility] Cardiologist:  [facility] Electrophysiologist:  None   Referring MD: Karie Schwalbe, MD   Chief Complaint  Patient presents with   Chest Pain    May 23, 2020   Heather Shaffer is a 71 y.o. female with a hx of chest pain . Hx of CP, moderate obesity . We are asked to see her by Dr Alphonsus Sias for further evaluation of her episodes of chest pains.  She thinks the CP is due to stress.   Is going through a separation after 40+ years of marriage.  Also looking after her 35 year old mother   CP is been going on for few months  CP is not exertional ,  Mostly occurred while sitting  CP was a mid sternal chest pressure.  .  Would last a few seconds but occurred in a repeating pattern. Not associated with dyspne, not assoc with diaphoresis .   Works in the yard.   Working in the yaard did not bring on the cp  Has gained weight over the year ( covid and other stresses. )   Raises flower and also some vegetables   Has not had any chest pain for the last week or so .   Father died of an MI during a heart cath .   She was told he died after an injection of the contrast.   Son had an MI  Mother has CAD   April 30, 2022: Heather Shaffer is seen today for follow-up visit.  She was seen several years ago for evaluation of chest pain.  She did not want to have a stress test.  She thought it was all due to stress. She has a strong family history of coronary artery disease.  She does not want to have IV contrast for fear of an allergy to  She had a contrast CT in 2010   Does not want to have a nuclear stress test either .   Past Medical History:  Diagnosis Date   Allergy    Back pain    Back injections at [facility] for back pain &  spinal stenosis   Barrett's esophagus    Carpal tunnel syndrome    Diabetes mellitus    Diverticulitis     DVT (deep venous thrombosis) (HCC)    hx of 1998 post knee replacement   Hemorrhoids    Hiatal hernia    History of nephrolithiasis    Osteoarthritis, knee    Uterine fibroids affecting pregnancy     Past Surgical History:  Procedure Laterality Date   LAPAROSCOPY     TOTAL KNEE ARTHROPLASTY     right    Current Medications: Current Meds  Medication Sig   Apple Cider Vinegar 600 MG CAPS Take by mouth daily.   B Complex-C (SUPER B COMPLEX PO) Take by mouth daily. Plus electrolytes   Cholecalciferol (VITAMIN D3) 25 MCG (1000 UT) CAPS Take by mouth daily.   CINNAMON PO Take 1,000 mg by mouth daily.   glucose blood (ONETOUCH VERIO) test strip USE TO CHECK BLOOD SUGAR ONCE A DAY. DX CODE E11.49   metFORMIN (GLUCOPHAGE-XR) 500 MG 24 hr tablet TAKE 1 TABLET BY MOUTH EVERYDAY AT BEDTIME   MILK THISTLE PO Take 1,000 mg by mouth daily.   Multiple Vitamins-Minerals (HAIR/SKIN/NAILS/BIOTIN PO) Take by mouth  daily. Extra strength   Multiple Vitamins-Minerals (MULTIVITAMIN ADULTS 50+) TABS Take by mouth daily.   OneTouch Delica Lancets 33G MISC 1 Units by Does not apply route daily. Use 1 a day to check blood sugar Dx Code E11.49   rosuvastatin (CRESTOR) 10 MG tablet Take 10 mg by mouth once a week.   vitamin E 180 MG (400 UNITS) capsule Take 400 Units by mouth daily.     Allergies:   Bee venom, Sulfamethoxazole-trimethoprim, Latex, and Tdap [tetanus-diphth-acell pertussis]   Social History   Socioeconomic History   Marital status: Legally Separated    Spouse name: Not on file   Number of children: 3   Years of education: Not on file   Highest education level: Not on file  Occupational History   Occupation: Veterinary surgeon, Astronomer    Comment: retired   Occupation: [employer]    Comment: retired  Tobacco Use   Smoking status: Former    Passive exposure: Never   Smokeless tobacco: Never   Tobacco comments:    quit 1975  Substance and Sexual Activity   Alcohol use: No    Drug use: No   Sexual activity: Not Currently    Birth control/protection: Post-menopausal  Other Topics Concern   Not on file  Social History Narrative   No living will   Daughter should be health care POA Abran Duke)   Would accept resuscitation   Would probably accept tube feeds   Social Determinants of Health   Financial Resource Strain: Not on file  Food Insecurity: Not on file  Transportation Needs: Not on file  Physical Activity: Not on file  Stress: Not on file  Social Connections: Not on file     Family History: The patient's family history includes Cancer in her sister; Diabetes in her son; Heart disease in her son.  ROS:   Please see the history of present illness.     All other systems reviewed and are negative.  EKGs/Labs/Other Studies Reviewed:    The following studies were reviewed today:   EKG:     April 30, 2022: Sinus bradycardia 58.  Otherwise normal EKG  Recent Labs: 07/25/2021: ALT 13 04/10/2022: BUN 25; Creatinine, Ser 1.03; Hemoglobin 12.6; Platelets 197.0; Potassium 4.3; Sodium 142; TSH 0.62  Recent Lipid Panel    Component Value Date/Time   CHOL 200 07/25/2021 1054   TRIG 37.0 07/25/2021 1054   HDL 74.80 07/25/2021 1054   CHOLHDL 3 07/25/2021 1054   VLDL 7.4 07/25/2021 1054   LDLCALC 117 (H) 07/25/2021 1054   LDLDIRECT 126.9 06/17/2011 1304     Physical Exam: Blood pressure 138/70, pulse (!) 58, height 5' 2.5" (1.588 m), weight 182 lb (82.6 kg), SpO2 99 %.  GEN:  Wmoderately obese female, NAD  HEENT: Normal NECK: No JVD; No carotid bruits LYMPHATICS: No lymphadenopathy CARDIAC: RRR =, no murmurs, rubs, gallops RESPIRATORY:  Clear to auscultation without rales, wheezing or rhonchi  ABDOMEN: Soft, non-tender, non-distended MUSCULOSKELETAL:  No edema; No deformity  SKIN: Warm and dry NEUROLOGIC:  Alert and oriented x 3   ASSESSMENT:    1. Chest pain, unspecified type   2. Type 2 diabetes mellitus with neurological  manifestations, controlled (HCC)     PLAN:       CP - .  She continues to have episodes of chest discomfort.  She refuses to have any further testing.  She specifically denies nuclear stress testing, coronary CT angiogram, heart catheterization. She is scared of being  allergic to the IV contrast.  She stated that her father died of a heart attack during a heart catheterization.  I found that she had a contrast CT scan in December, 2010 I told her that she received IV contrast in 2010 and appeared to have tolerated it quite well.  Despite this reassurance she still does not want to have any further testing.  She has no acute changes on her EKG.  We will see her back on an as-needed basis. 2. Diabetes-   3.    Hyperlipidemia -managed by her primary medical doctor.      Medication Adjustments/Labs and Tests Ordered: Current medicines are reviewed at length with the patient today.  Concerns regarding medicines are outlined above.  Orders Placed This Encounter  Procedures   EKG 12-Lead   No orders of the defined types were placed in this encounter.   Patient Instructions  Medication Instructions:  Your physician recommends that you continue on your current medications as directed. Please refer to the Current Medication list given to you today.  *If you need a refill on your cardiac medications before your next appointment, please call your pharmacy*   Lab Work: NONE If you have labs (blood work) drawn today and your tests are completely normal, you will receive your results only by: MyChart Message (if you have MyChart) OR A paper copy in the mail If you have any lab test that is abnormal or we need to change your treatment, we will call you to review the results.   Testing/Procedures: NONE   Follow-Up: At [facility], you and your health needs are our priority.  As part of our continuing mission to provide you with exceptional heart care, we have created designated  Provider Care Teams.  These Care Teams include your primary Cardiologist (physician) and Advanced Practice Providers (APPs -  Physician Assistants and Nurse Practitioners) who all work together to provide you with the care you need, when you need it.  Your next appointment:   As Needed  The format for your next appointment:   In Person  Provider:   Lissa Hoard or Alben Spittle { Important Information About Sugar         Signed, Kristeen Miss, MD  04/30/2022 12:03 PM    [facility]

## 2022-05-01 ENCOUNTER — Encounter: Payer: Self-pay | Admitting: Cardiovascular Disease

## 2022-05-08 DIAGNOSIS — G5602 Carpal tunnel syndrome, left upper limb: Secondary | ICD-10-CM | POA: Diagnosis not present

## 2022-05-08 DIAGNOSIS — G5601 Carpal tunnel syndrome, right upper limb: Secondary | ICD-10-CM | POA: Diagnosis not present

## 2022-06-10 ENCOUNTER — Other Ambulatory Visit: Payer: Self-pay | Admitting: *Deleted

## 2022-06-10 NOTE — Patient Outreach (Signed)
  Care Coordination   06/10/2022 Name: Heather Shaffer MRN: [id number] DOB: 03-07-1951   Care Coordination Outreach Attempts:  An unsuccessful telephone outreach was attempted today to offer the patient information about available care coordination services as a benefit of their health plan.   Follow Up Plan:  Additional outreach attempts will be made to offer the patient care coordination information and services.   Encounter Outcome:  No Answer  Care Coordination Interventions Activated:  No   Care Coordination Interventions:  No, not indicated    Emelia Loron RN, BSN [place] [phone] [email or web address]

## 2022-06-10 NOTE — Patient Outreach (Signed)
  Care Coordination   06/10/2022 Name: Heather Shaffer MRN: [id number] DOB: 06-02-1951   Care Coordination Outreach Attempts:  An unsuccessful telephone outreach was attempted today to offer the patient information about available care coordination services as a benefit of their health plan.  Nurse answered incoming call back from patient.  Follow Up Plan:  No further outreach attempts will be made at this time. We have been unable to contact the patient to offer or enroll patient in care coordination services  Encounter Outcome:  Pt. Refused  Care Coordination Interventions Activated:  No   Care Coordination Interventions:  No, not indicated    Emelia Loron RN, BSN [place] [phone] [email or web address]

## 2022-06-20 DIAGNOSIS — G5603 Carpal tunnel syndrome, bilateral upper limbs: Secondary | ICD-10-CM | POA: Diagnosis not present

## 2022-07-04 DIAGNOSIS — K219 Gastro-esophageal reflux disease without esophagitis: Secondary | ICD-10-CM | POA: Diagnosis not present

## 2022-07-04 DIAGNOSIS — G629 Polyneuropathy, unspecified: Secondary | ICD-10-CM | POA: Diagnosis not present

## 2022-07-04 DIAGNOSIS — I82409 Acute embolism and thrombosis of unspecified deep veins of unspecified lower extremity: Secondary | ICD-10-CM | POA: Diagnosis not present

## 2022-07-04 DIAGNOSIS — G5601 Carpal tunnel syndrome, right upper limb: Secondary | ICD-10-CM | POA: Diagnosis not present

## 2022-07-04 DIAGNOSIS — M1990 Unspecified osteoarthritis, unspecified site: Secondary | ICD-10-CM | POA: Diagnosis not present

## 2022-07-04 DIAGNOSIS — E119 Type 2 diabetes mellitus without complications: Secondary | ICD-10-CM | POA: Diagnosis not present

## 2022-07-04 DIAGNOSIS — G709 Myoneural disorder, unspecified: Secondary | ICD-10-CM | POA: Diagnosis not present

## 2022-07-31 ENCOUNTER — Encounter: Payer: Self-pay | Admitting: Internal Medicine

## 2022-07-31 ENCOUNTER — Ambulatory Visit (INDEPENDENT_AMBULATORY_CARE_PROVIDER_SITE_OTHER): Payer: Medicare Other | Admitting: Internal Medicine

## 2022-07-31 VITALS — BP 124/74 | HR 52 | Temp 97.9°F | Ht 62.5 in | Wt 189.0 lb

## 2022-07-31 DIAGNOSIS — E1149 Type 2 diabetes mellitus with other diabetic neurological complication: Secondary | ICD-10-CM | POA: Diagnosis not present

## 2022-07-31 DIAGNOSIS — Z0000 Encounter for general adult medical examination without abnormal findings: Secondary | ICD-10-CM

## 2022-07-31 DIAGNOSIS — F39 Unspecified mood [affective] disorder: Secondary | ICD-10-CM | POA: Diagnosis not present

## 2022-07-31 DIAGNOSIS — Z23 Encounter for immunization: Secondary | ICD-10-CM

## 2022-07-31 DIAGNOSIS — N1831 Chronic kidney disease, stage 3a: Secondary | ICD-10-CM | POA: Diagnosis not present

## 2022-07-31 DIAGNOSIS — E11610 Type 2 diabetes mellitus with diabetic neuropathic arthropathy: Secondary | ICD-10-CM | POA: Diagnosis not present

## 2022-07-31 LAB — RENAL FUNCTION PANEL
Albumin: 4.2 g/dL (ref 3.5–5.2)
BUN: 22 mg/dL (ref 6–23)
CO2: 32 mEq/L (ref 19–32)
Calcium: 9.9 mg/dL (ref 8.4–10.5)
Chloride: 102 mEq/L (ref 96–112)
Creatinine, Ser: 1 mg/dL (ref 0.40–1.20)
GFR: 56.54 mL/min — ABNORMAL LOW (ref >60.00)
Glucose, Bld: 119 mg/dL — ABNORMAL HIGH (ref 70–99)
Phosphorus: 4.1 mg/dL (ref 2.3–4.6)
Potassium: 4.3 mEq/L (ref 3.5–5.1)
Sodium: 140 mEq/L (ref 135–145)

## 2022-07-31 LAB — CBC
HCT: 40.3 % (ref 36.0–46.0)
Hemoglobin: 13 g/dL (ref 12.0–15.0)
MCHC: 32.2 g/dL (ref 30.0–36.0)
MCV: 85.8 fl (ref 78.0–100.0)
Platelets: 188 10*3/uL (ref 150.0–400.0)
RBC: 4.7 Mil/uL (ref 3.87–5.11)
RDW: 15 % (ref 11.5–15.5)
WBC: 3.9 10*3/uL — ABNORMAL LOW (ref 4.0–10.5)

## 2022-07-31 LAB — HM DIABETES FOOT EXAM

## 2022-07-31 LAB — MICROALBUMIN / CREATININE URINE RATIO
Creatinine,U: 53 mg/dL
Microalb Creat Ratio: 1.3 mg/g (ref 0.0–30.0)
Microalb, Ur: <0.7 mg/dL (ref 0.0–1.9)

## 2022-07-31 LAB — LIPID PANEL
Cholesterol: 200 mg/dL (ref 0–200)
HDL: 82.4 mg/dL (ref >39.00)
LDL Cholesterol: 111 mg/dL — ABNORMAL HIGH (ref 0–99)
NonHDL: 117.28
Total CHOL/HDL Ratio: 2
Triglycerides: 31 mg/dL (ref 0.0–149.0)
VLDL: 6.2 mg/dL (ref 0.0–40.0)

## 2022-07-31 LAB — HEPATIC FUNCTION PANEL
ALT: 15 U/L (ref 0–35)
AST: 19 U/L (ref 0–37)
Albumin: 4.2 g/dL (ref 3.5–5.2)
Alkaline Phosphatase: 79 U/L (ref 39–117)
Bilirubin, Direct: 0.1 mg/dL (ref 0.0–0.3)
Total Bilirubin: 0.6 mg/dL (ref 0.2–1.2)
Total Protein: 7.4 g/dL (ref 6.0–8.3)

## 2022-07-31 LAB — HEMOGLOBIN A1C: Hgb A1c MFr Bld: 7 % — ABNORMAL HIGH (ref 4.6–6.5)

## 2022-07-31 NOTE — Assessment & Plan Note (Signed)
I have personally reviewed the Medicare Annual Wellness questionnaire and have noted 1. The patient's medical and social history 2. Their use of alcohol, tobacco or illicit drugs 3. Their current medications and supplements 4. The patient's functional ability including ADL's, fall risks, home safety risks and hearing or visual             impairment. 5. Diet and physical activities 6. Evidence for depression or mood disorders  The patients weight, height, BMI and visual acuity have been recorded in the chart I have made referrals, counseling and provided education to the patient based review of the above and I have provided the pt with a written personalized care plan for preventive services.  I have provided you with a copy of your personalized plan for preventive services. Please take the time to review along with your updated medication list.  Recent colonoscopy--recall 2029  Mammogram every 2 years at least till 74--due next August Flu vaccine today COVID vaccine at pharmacy Consider shingrix  Does exercise--limited now since surgery

## 2022-07-31 NOTE — Assessment & Plan Note (Signed)
Chronic dysthymia No meds indicated

## 2022-07-31 NOTE — Assessment & Plan Note (Signed)
Limits her walking

## 2022-07-31 NOTE — Assessment & Plan Note (Addendum)
Seems to still have good control Metformin '500mg'$  daily Neuropathy not as bad--off gabapentin  Hasn't been taking the crestor---recommended at least once or twice a week

## 2022-07-31 NOTE — Assessment & Plan Note (Signed)
Borderline low  Will recheck  BP Readings from Last 3 Encounters:  07/31/22 124/74  04/30/22 138/70  04/10/22 118/70

## 2022-07-31 NOTE — Progress Notes (Signed)
Hearing Screening - Comments:: Passed whisper test Vision Screening - Comments:: December 2022  

## 2022-07-31 NOTE — Addendum Note (Signed)
Addended by: Pilar Grammes on: 07/31/2022 12:29 PM   Modules accepted: Orders

## 2022-07-31 NOTE — Progress Notes (Signed)
Subjective:    Patient ID: Heather Shaffer, female    DOB: 12-17-1950, 71 y.o.   MRN: [id number]  HPI Here for Medicare wellness visit and follow up of chronic health conditions Reviewed advanced directives Reviewed other doctors---Dr Klifto--ortho, Dr Nahser--cardiologist, Dr Chelsea Aus, [facility],  Just had right carpal tunnel surgery recently. No hospitalizations Vision is fine Hearing is good No falls Still gets depressed at times. 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 Still separated--no contact with ex (or rare with kids). Not anhedonic No alcohol or tobacco Does yard work--not recently due to surgery. Uses glider in home (on hold now) Independent with instrumental ADLs Mild memory issues  Doing okay Still can't walk well with her abnormal right foot--balance issues  Checks sugars daily 125-155 fasting No hypoglycemia No longer on gabapentin--neuropathy is not as bad  No recent heartburn No dysphagia  Last GFR 54  Still gets the same chest pain--with anxiety. Not exertional No SOB No dizziness or syncope Mild foot swelling --gone quickly  Current Outpatient Medications on File Prior to Visit  Medication Sig Dispense Refill   Apple Cider Vinegar 600 MG CAPS Take by mouth daily.     B Complex-C (SUPER B COMPLEX PO) Take by mouth daily. Plus electrolytes     Biotin 5000 MCG CAPS Take by mouth daily.     Cholecalciferol (VITAMIN D3) 25 MCG (1000 UT) CAPS Take by mouth daily.     CINNAMON PO Take 1,000 mg by mouth daily.     Cyanocobalamin (VITAMIN B-12) 5000 MCG SUBL Place under the tongue daily.     glucose blood (ONETOUCH VERIO) test strip USE TO CHECK BLOOD SUGAR ONCE A DAY. DX CODE E11.49 25 strip 19   metFORMIN (GLUCOPHAGE-XR) 500 MG 24 hr tablet TAKE 1 TABLET BY MOUTH EVERYDAY AT BEDTIME 90 tablet 3   MILK THISTLE PO Take 1,000 mg by mouth daily.     Multiple Vitamins-Minerals (MULTIVITAMIN ADULTS 50+) TABS Take by mouth daily.     OneTouch Delica Lancets 17E  MISC 1 Units by Does not apply route daily. Use 1 a day to check blood sugar Dx Code E11.49 100 each 4   vitamin E 180 MG (400 UNITS) capsule Take 400 Units by mouth daily.     No current facility-administered medications on file prior to visit.    Allergies  Allergen Reactions   Bee Venom Swelling   Sulfamethoxazole-Trimethoprim     REACTION: tongue swells   Latex Rash   Tdap [Tetanus-Diphth-Acell Pertussis] Rash    Past Medical History:  Diagnosis Date   Allergy    Back pain    Back injections at [facility] for back pain &  spinal stenosis   Barrett's esophagus    Carpal tunnel syndrome    Diabetes mellitus    Diverticulitis    DVT (deep venous thrombosis) (HCC)    hx of 1998 post knee replacement   Hemorrhoids    Hiatal hernia    History of nephrolithiasis    Osteoarthritis, knee    Uterine fibroids affecting pregnancy     Past Surgical History:  Procedure Laterality Date   CARPAL TUNNEL RELEASE Right    LAPAROSCOPY     TOTAL KNEE ARTHROPLASTY     right    Family History  Problem Relation Age of Onset   Cancer Sister    Diabetes Son    Heart disease Son        cardiomyopathy    Social History  Socioeconomic History   Marital status: Legally Separated    Spouse name: Not on file   Number of children: 3   Years of education: Not on file   Highest education level: Not on file  Occupational History   Occupation: Cabin crew, Art therapist    Comment: retired   Occupation: [employer]    Comment: retired  Tobacco Use   Smoking status: Former    Passive exposure: Never   Smokeless tobacco: Never   Tobacco comments:    quit 1975  Substance and Sexual Activity   Alcohol use: No   Drug use: No   Sexual activity: Not Currently    Birth control/protection: Post-menopausal  Other Topics Concern   Not on file  Social History Narrative   No living will   Daughter should be health care POA Arsenio Loader)   Would accept resuscitation   Would probably  accept tube feeds   Social Determinants of Health   Financial Resource Strain: Not on file  Food Insecurity: Not on file  Transportation Needs: Not on file  Physical Activity: Not on file  Stress: Not on file  Social Connections: Not on file  Intimate Partner Violence: Not on file   Review of Systems Appetite is good Has gained some weight--now can't do yard work, etc Never slept well Wears seat belt Needs implants--but not sure she wants them. No local dentist No concerning skin lesions Bowels move fine--no blood No dysuria or blood. No incontinence    Objective:   Physical Exam Constitutional:      Appearance: Normal appearance.  HENT:     Mouth/Throat:     Comments: No lesions Eyes:     Conjunctiva/sclera: Conjunctivae normal.     Pupils: Pupils are equal, round, and reactive to light.  Cardiovascular:     Rate and Rhythm: Normal rate and regular rhythm.     Heart sounds: No murmur heard.    No gallop.     Comments: Faint pedal pulses Pulmonary:     Effort: Pulmonary effort is normal.     Breath sounds: Normal breath sounds. No wheezing or rales.  Abdominal:     Palpations: Abdomen is soft.     Tenderness: There is no abdominal tenderness.  Musculoskeletal:     Cervical back: Neck supple.     Right lower leg: No edema.     Left lower leg: No edema.     Comments: Charcot changes right foot  Lymphadenopathy:     Cervical: No cervical adenopathy.  Skin:    Findings: No lesion or rash.     Comments: No foot lesions  Neurological:     General: No focal deficit present.     Mental Status: She is alert and oriented to person, place, and time.     Comments: Decreased sensation in feet Mini-cog normal  Psychiatric:        Mood and Affect: Mood normal.        Behavior: Behavior normal.            Assessment & Plan:

## 2022-08-02 DIAGNOSIS — M79641 Pain in right hand: Secondary | ICD-10-CM | POA: Diagnosis not present

## 2022-08-02 DIAGNOSIS — Z4789 Encounter for other orthopedic aftercare: Secondary | ICD-10-CM | POA: Diagnosis not present

## 2022-11-02 DIAGNOSIS — E119 Type 2 diabetes mellitus without complications: Secondary | ICD-10-CM | POA: Diagnosis not present

## 2022-11-02 LAB — HM DIABETES EYE EXAM

## 2022-11-15 ENCOUNTER — Other Ambulatory Visit: Payer: Self-pay | Admitting: Internal Medicine

## 2023-01-29 ENCOUNTER — Ambulatory Visit (INDEPENDENT_AMBULATORY_CARE_PROVIDER_SITE_OTHER): Payer: Medicare Other | Admitting: Internal Medicine

## 2023-01-29 ENCOUNTER — Encounter: Payer: Self-pay | Admitting: Internal Medicine

## 2023-01-29 VITALS — BP 116/76 | Temp 97.0°F | Ht 62.5 in | Wt 169.0 lb

## 2023-01-29 DIAGNOSIS — E1149 Type 2 diabetes mellitus with other diabetic neurological complication: Secondary | ICD-10-CM

## 2023-01-29 DIAGNOSIS — F39 Unspecified mood [affective] disorder: Secondary | ICD-10-CM | POA: Diagnosis not present

## 2023-01-29 DIAGNOSIS — N1831 Chronic kidney disease, stage 3a: Secondary | ICD-10-CM | POA: Diagnosis not present

## 2023-01-29 LAB — POCT GLYCOSYLATED HEMOGLOBIN (HGB A1C): Hemoglobin A1C: 7.9 % — AB (ref 4.0–5.6)

## 2023-01-29 MED ORDER — EMPAGLIFLOZIN 10 MG PO TABS: 10.0000 mg | ORAL_TABLET | Freq: Every day | ORAL | 3 refills | Status: DC

## 2023-01-29 NOTE — Assessment & Plan Note (Signed)
Lab Results  Component Value Date   HGBA1C 7.9 (A) 01/29/2023   Clearly worse Has lost weight, irregular eating, etc Discussed options--wait, double metformin, add jardiance Will go ahead with the jardiance 10mg 

## 2023-01-29 NOTE — Progress Notes (Signed)
Subjective:    Patient ID: Heather Shaffer, female    DOB: Feb 11, 1951, 72 y.o.   MRN: [id number]  HPI Here for follow up of diabetes and other chronic health conditions  Having some issues with grieving --mom just died and funeral in 3 days Fairly devastated  Having more trouble with her memory--for a couple of months  Sugars are all over the place---73-294 Very confusing to her Weight is down 20#--she didn't realize it was that much Eating okay---but on the road constantly to her mom (lives 2 hours away)  No SOB Occasional chest pain---not necessarily exertional No change in exercise tolerance--still does yard and housework Does feel her heart---sudden sense of sharp pain--seconds Not palpitations  Some feet swelling---no change  GFR has been stable in the 50's  Current Outpatient Medications on File Prior to Visit  Medication Sig Dispense Refill   Apple Cider Vinegar 600 MG CAPS Take by mouth daily.     B Complex-C (SUPER B COMPLEX PO) Take by mouth daily. Plus electrolytes     Cholecalciferol (VITAMIN D3) 25 MCG (1000 UT) CAPS Take by mouth daily.     CINNAMON PO Take 1,000 mg by mouth daily.     Cyanocobalamin (VITAMIN B-12) 5000 MCG SUBL Place under the tongue daily.     glucose blood (ONETOUCH VERIO) test strip USE TO CHECK BLOOD SUGAR ONCE A DAY. DX CODE E11.49 25 strip 19   metFORMIN (GLUCOPHAGE-XR) 500 MG 24 hr tablet TAKE 1 TABLET BY MOUTH EVERYDAY AT BEDTIME 90 tablet 3   Multiple Vitamins-Minerals (HAIR SKIN AND NAILS FORMULA PO) Take by mouth.     Multiple Vitamins-Minerals (MULTIVITAMIN ADULTS 50+) TABS Take by mouth daily.     OneTouch Delica Lancets 99991111 MISC 1 Units by Does not apply route daily. Use 1 a day to check blood sugar Dx Code E11.49 100 each 4   vitamin E 180 MG (400 UNITS) capsule Take 400 Units by mouth daily.     No current facility-administered medications on file prior to visit.    Allergies  Allergen Reactions   Bee Venom Swelling    Sulfamethoxazole-Trimethoprim     REACTION: tongue swells   Latex Rash   Tdap [Tetanus-Diphth-Acell Pertussis] Rash    Past Medical History:  Diagnosis Date   Allergy    Back pain    Back injections at [facility] for back pain &  spinal stenosis   Barrett's esophagus    Carpal tunnel syndrome    Diabetes mellitus    Diverticulitis    DVT (deep venous thrombosis) (HCC)    hx of 1998 post knee replacement   Hemorrhoids    Hiatal hernia    History of nephrolithiasis    Osteoarthritis, knee    Uterine fibroids affecting pregnancy     Past Surgical History:  Procedure Laterality Date   CARPAL TUNNEL RELEASE Right    LAPAROSCOPY     TOTAL KNEE ARTHROPLASTY     right    Family History  Problem Relation Age of Onset   Cancer Sister    Diabetes Son    Heart disease Son        cardiomyopathy    Social History   Socioeconomic History   Marital status: Legally Separated    Spouse name: Not on file   Number of children: 3   Years of education: Not on file   Highest education level: Bachelor's degree (e.g., BA, AB, BS)  Occupational History   Occupation: Cabin crew, 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   Occupation: [employer]    Comment: retired  Tobacco Use   Smoking status: Former    Passive exposure: Never   Smokeless tobacco: Never   Tobacco comments:    quit 1975  Substance and Sexual Activity   Alcohol use: No   Drug use: No   Sexual activity: Not Currently    Birth control/protection: Post-menopausal  Other Topics Concern   Not on file  Social History Narrative   No living will   Daughter should be health care POA Arsenio Loader)   Would accept resuscitation   Would probably accept tube feeds   Social Determinants of Health   Financial Resource Strain: Low Risk  (01/26/2023)   Overall Financial Resource Strain (CARDIA)    Difficulty of Paying Living Expenses: Not hard at all  Food Insecurity: No Food Insecurity (01/26/2023)   Hunger Vital Sign     Worried About Running Out of Food in the Last Year: Never true    [place] in the Last Year: Never true  Transportation Needs: No Transportation Needs (01/26/2023)   PRAPARE - Hydrologist (Medical): No    Lack of Transportation (Non-Medical): No  Physical Activity: Sufficiently Active (01/26/2023)   Exercise Vital Sign    Days of Exercise per Week: 3 days    Minutes of Exercise per Session: 70 min  Stress: Stress Concern Present (01/26/2023)   [place]    Feeling of Stress : Rather much  Social Connections: Moderately Integrated (01/26/2023)   Social Connection and Isolation Panel [NHANES]    Frequency of Communication with Friends and Family: More than three times a week    Frequency of Social Gatherings with Friends and Family: More than three times a week    Attends Religious Services: More than 4 times per year    Active Member of [employer] or Organizations: Yes    Attends Music therapist: More than 4 times per year    Marital Status: Separated  Intimate Partner Violence: Not on file   Review of Systems Some left ear ringing--no change in hearing Not sleeping well    Objective:   Physical Exam Constitutional:      Appearance: Normal appearance.  Cardiovascular:     Rate and Rhythm: Normal rate and regular rhythm.     Pulses: Normal pulses.     Heart sounds: No murmur heard.    No gallop.  Pulmonary:     Effort: Pulmonary effort is normal.     Breath sounds: Normal breath sounds. No wheezing or rales.  Musculoskeletal:     Right lower leg: No edema.     Left lower leg: No edema.  Neurological:     Mental Status: She is alert.  Psychiatric:     Comments: Clearly in mourning            Assessment & Plan:

## 2023-01-29 NOTE — Assessment & Plan Note (Signed)
Clearly grieving now---"devastated" Discussed counseling through hospice No meds at this point

## 2023-01-29 NOTE — Assessment & Plan Note (Signed)
Stable GFR No proteinuria BP tends to run low---will hold off on ACEI/ARB Try jardiance 10

## 2023-03-06 ENCOUNTER — Telehealth: Payer: Self-pay

## 2023-03-06 NOTE — Patient Outreach (Signed)
  Care Coordination   03/06/2023 Name: Heather Shaffer MRN: [id number] DOB: 06/08/1951   Care Coordination Outreach Attempts:  An unsuccessful telephone outreach was attempted today to offer the patient information about available care coordination services. HIPAA compliant voice message left.   Follow Up Plan:  Additional outreach attempts will be made to offer the patient care coordination information and services.   Encounter Outcome:  No Answer   Care Coordination Interventions:  No, not indicated    George Ina [facility] Care Coordination [phone] direct line

## 2023-03-23 ENCOUNTER — Other Ambulatory Visit: Payer: Self-pay | Admitting: Internal Medicine

## 2023-03-24 ENCOUNTER — Encounter: Payer: Self-pay | Admitting: Internal Medicine

## 2023-03-24 ENCOUNTER — Ambulatory Visit (INDEPENDENT_AMBULATORY_CARE_PROVIDER_SITE_OTHER): Payer: Medicare Other | Admitting: Internal Medicine

## 2023-03-24 VITALS — BP 120/78 | HR 70 | Temp 97.8°F | Ht 62.5 in | Wt 176.0 lb

## 2023-03-24 DIAGNOSIS — H9312 Tinnitus, left ear: Secondary | ICD-10-CM | POA: Insufficient documentation

## 2023-03-24 DIAGNOSIS — S1096XA Insect bite of unspecified part of neck, initial encounter: Secondary | ICD-10-CM | POA: Insufficient documentation

## 2023-03-24 DIAGNOSIS — W57XXXA Bitten or stung by nonvenomous insect and other nonvenomous arthropods, initial encounter: Secondary | ICD-10-CM

## 2023-03-24 MED ORDER — DOXYCYCLINE HYCLATE 100 MG PO TABS: 100.0000 mg | ORAL_TABLET | Freq: Two times a day (BID) | ORAL | 0 refills | Status: DC

## 2023-03-24 NOTE — Progress Notes (Signed)
Subjective:    Patient ID: Heather Shaffer, female    DOB: January 20, 1951, 72 y.o.   MRN: [id number]  HPI Here due to not feeling well after a tick bite  Found a tick on her throat a week ago Couldn't have been on for more than a year---had been out planting flowers Took it off with tweezers--not engorged  Now has ringing in her left ear and trouble hearing Some joint aches--but no clear swelling (left knee is the worst) No fever No sig headache--just feels off balance  Itchy at the spot of tick--but no rash  Current Outpatient Medications on File Prior to Visit  Medication Sig Dispense Refill   Apple Cider Vinegar 600 MG CAPS Take by mouth daily.     B Complex-C (SUPER B COMPLEX PO) Take by mouth daily. Plus electrolytes     Cholecalciferol (VITAMIN D3) 25 MCG (1000 UT) CAPS Take by mouth daily.     CINNAMON PO Take 1,000 mg by mouth daily.     Cyanocobalamin (VITAMIN B-12) 5000 MCG SUBL Place under the tongue daily.     glucose blood (ONETOUCH VERIO) test strip USE TO CHECK BLOOD SUGAR ONCE A DAY. DX CODE E11.49 25 strip 19   metFORMIN (GLUCOPHAGE-XR) 500 MG 24 hr tablet TAKE 1 TABLET BY MOUTH EVERYDAY AT BEDTIME 90 tablet 3   Multiple Vitamins-Minerals (HAIR SKIN AND NAILS FORMULA PO) Take by mouth.     Multiple Vitamins-Minerals (MULTIVITAMIN ADULTS 50+) TABS Take by mouth daily.     OneTouch Delica Lancets 33G MISC 1 Units by Does not apply route daily. Use 1 a day to check blood sugar Dx Code E11.49 100 each 4   vitamin E 180 MG (400 UNITS) capsule Take 400 Units by mouth daily.     empagliflozin (JARDIANCE) 10 MG TABS tablet Take 1 tablet (10 mg total) by mouth daily before breakfast. (Patient not taking: Reported on 03/24/2023) 90 tablet 3   No current facility-administered medications on file prior to visit.    Allergies  Allergen Reactions   Bee Venom Swelling   Sulfamethoxazole-Trimethoprim     REACTION: tongue swells   Latex Rash   Tdap [Tetanus-Diphth-Acell  Pertussis] Rash    Past Medical History:  Diagnosis Date   Allergy    Back pain    Back injections at [facility] for back pain &  spinal stenosis   Barrett's esophagus    Carpal tunnel syndrome    Diabetes mellitus    Diverticulitis    DVT (deep venous thrombosis) (HCC)    hx of 1998 post knee replacement   Hemorrhoids    Hiatal hernia    History of nephrolithiasis    Osteoarthritis, knee    Uterine fibroids affecting pregnancy     Past Surgical History:  Procedure Laterality Date   CARPAL TUNNEL RELEASE Right    LAPAROSCOPY     TOTAL KNEE ARTHROPLASTY     right    Family History  Problem Relation Age of Onset   Cancer Sister    Diabetes Son    Heart disease Son        cardiomyopathy    Social History   Socioeconomic History   Marital status: Legally Separated    Spouse name: Not on file   Number of children: 3   Years of education: Not on file   Highest education level: Bachelor's degree (e.g., BA, AB, BS)  Occupational History   Occupation: Veterinary surgeon, Astronomer    Comment:  retired   Occupation: [employer]    Comment: retired  Tobacco Use   Smoking status: Former    Passive exposure: Never   Smokeless tobacco: Never   Tobacco comments:    quit 1975  Substance and Sexual Activity   Alcohol use: No   Drug use: No   Sexual activity: Not Currently    Birth control/protection: Post-menopausal  Other Topics Concern   Not on file  Social History Narrative   No living will   Daughter should be health care POA Abran Duke)   Would accept resuscitation   Would probably accept tube feeds   Social Determinants of Health   Financial Resource Strain: Low Risk  (01/26/2023)   Overall Financial Resource Strain (CARDIA)    Difficulty of Paying Living Expenses: Not hard at all  Food Insecurity: No Food Insecurity (01/26/2023)   Hunger Vital Sign    Worried About Running Out of Food in the Last Year: Never true    Ran Out of Food in the Last Year: Never  true  Transportation Needs: No Transportation Needs (01/26/2023)   PRAPARE - Administrator, Civil Service (Medical): No    Lack of Transportation (Non-Medical): No  Physical Activity: Sufficiently Active (01/26/2023)   Exercise Vital Sign    Days of Exercise per Week: 3 days    Minutes of Exercise per Session: 70 min  Stress: Stress Concern Present (01/26/2023)   [employer] of Occupational Health - Occupational Stress Questionnaire    Feeling of Stress : Rather much  Social Connections: Moderately Integrated (01/26/2023)   Social Connection and Isolation Panel [NHANES]    Frequency of Communication with Friends and Family: More than three times a week    Frequency of Social Gatherings with Friends and Family: More than three times a week    Attends Religious Services: More than 4 times per year    Active Member of [employer] or Organizations: Yes    Attends Engineer, structural: More than 4 times per year    Marital Status: Separated  Intimate Partner Violence: Not on file   Review of Systems No meds for this No N/V     Objective:   Physical Exam Constitutional:      Appearance: Normal appearance.  HENT:     Ears:     Comments: Cerumen on left Musculoskeletal:     Comments: No acute synovitis in knees  Skin:    Comments: No rash at the tick site or elswhere  Neurological:     Mental Status: She is alert.            Assessment & Plan:

## 2023-03-24 NOTE — Assessment & Plan Note (Signed)
Told her I can't relate this to the tick bite or infection Cerumen cleaned---small canal but no clear infection She will take the doxy anyway--just in case Consider ENT evaluation if her symptoms persist

## 2023-03-24 NOTE — Assessment & Plan Note (Addendum)
No clear symptoms to suggest tick borne illness She is quite concerned about the joint aches/balance issues, etc Will try empiric doxy 100 bid x 7 days

## 2023-04-28 ENCOUNTER — Encounter: Payer: Self-pay | Admitting: Pharmacist

## 2023-04-28 NOTE — Progress Notes (Signed)
[facility]) [facility] Quality Pharmacy Team Statin Quality Measure Assessment  04/28/2023  Heather Shaffer Apr 07, 1951 [id number]  Per review of chart and payor information, patient has a diagnosis of diabetes but is not currently filling a statin prescription.  This places patient into the Statin Use In Patients with Diabetes (SUPD) measure for CMS.    Rosuvastatin 10 mg 1 tablet PO weekly discontinued 07/31/2022. New prescription will need to be issued or patient coded for SUPD exclusion if clinically appropriate.   The 10-year ASCVD risk score (Arnett DK, et al., 2019) is: 28.7%   Values used to calculate the score:     Age: 9 years     Sex: Female     Is Non-Hispanic African American: Yes     Diabetic: Yes     Tobacco smoker: No     Systolic Blood Pressure: 120 mmHg     Is BP treated: No     HDL Cholesterol: 82.4 mg/dL     Total Cholesterol: 200 mg/dL 01/09/6577     Component Value Date/Time   CHOL 200 07/31/2022 1223   TRIG 31.0 07/31/2022 1223   HDL 82.40 07/31/2022 1223   CHOLHDL 2 07/31/2022 1223   VLDL 6.2 07/31/2022 1223   LDLCALC 111 (H) 07/31/2022 1223   LDLDIRECT 126.9 06/17/2011 1304    Please consider ONE of the following recommendations:  Initiate high intensity statin Atorvastatin 40 mg once daily, #90, 3 refills   Rosuvastatin 20 mg once daily, #90, 3 refills    Initiate moderate intensity          statin with reduced frequency if prior          statin intolerance 1x weekly, #13, 3 refills   2x weekly, #26, 3 refills   3x weekly, #39, 3 refills    Code for past statin intolerance or  other exclusions (required annually)  Provider Requirements: Associate code during an office visit or telehealth encounter  Drug Induced Myopathy G72.0   Myopathy, unspecified G72.9   Myositis, unspecified M60.9   Rhabdomyolysis M62.82   Cirrhosis of liver K74.69   Prediabetes R73.03   PCOS E28.2   Thank you for allowing [facility] pharmacy to be a part of  this patient's care.   Reynold Bowen, PharmD Clinical Pharmacist [place] Direct Dial: [phone]

## 2023-04-30 ENCOUNTER — Encounter: Payer: Self-pay | Admitting: Internal Medicine

## 2023-04-30 ENCOUNTER — Ambulatory Visit (INDEPENDENT_AMBULATORY_CARE_PROVIDER_SITE_OTHER): Payer: Medicare Other | Admitting: Internal Medicine

## 2023-04-30 VITALS — BP 130/80 | HR 78 | Temp 97.3°F | Ht 63.0 in | Wt 185.0 lb

## 2023-04-30 DIAGNOSIS — H9190 Unspecified hearing loss, unspecified ear: Secondary | ICD-10-CM | POA: Insufficient documentation

## 2023-04-30 DIAGNOSIS — E1149 Type 2 diabetes mellitus with other diabetic neurological complication: Secondary | ICD-10-CM

## 2023-04-30 DIAGNOSIS — F39 Unspecified mood [affective] disorder: Secondary | ICD-10-CM | POA: Diagnosis not present

## 2023-04-30 DIAGNOSIS — H9192 Unspecified hearing loss, left ear: Secondary | ICD-10-CM | POA: Diagnosis not present

## 2023-04-30 MED ORDER — METFORMIN HCL ER 500 MG PO TB24: 1000.0000 mg | ORAL_TABLET | Freq: Two times a day (BID) | ORAL | 0 refills | Status: DC

## 2023-04-30 NOTE — Assessment & Plan Note (Signed)
Still grieving mother and now just lost mother in law (keeps up with her despite her long separation from her son) No MDD though

## 2023-04-30 NOTE — Progress Notes (Signed)
Subjective:    Patient ID: Heather Shaffer, female    DOB: September 25, 1951, 72 y.o.   MRN: [id number]  HPI Here for follow up of diabetes and other issues  Still have problems with left ear Tinnitus and hearing loss Some balance issues---has to be careful when she gets up (will hold on)  Did try the jardiance for a little while Worried about side effects---so she stopped it Checking sugars--had been better  Up this morning--after new stress  Still grieving her mom Recent family reunion was difficult MIL just died also Not doing so great with all this Gained back the weight she lost from the stress  Left knee bothering her a lot Seems to be since last visit  Current Outpatient Medications on File Prior to Visit  Medication Sig Dispense Refill   Apple Cider Vinegar 600 MG CAPS Take by mouth daily.     B Complex-C (SUPER B COMPLEX PO) Take by mouth daily. Plus electrolytes     Cholecalciferol (VITAMIN D3) 25 MCG (1000 UT) CAPS Take by mouth daily.     CINNAMON PO Take 1,000 mg by mouth daily.     Cyanocobalamin (VITAMIN B-12) 5000 MCG SUBL Place under the tongue daily.     metFORMIN (GLUCOPHAGE-XR) 500 MG 24 hr tablet TAKE 1 TABLET BY MOUTH EVERYDAY AT BEDTIME 90 tablet 3   Multiple Vitamins-Minerals (HAIR SKIN AND NAILS FORMULA PO) Take by mouth.     Multiple Vitamins-Minerals (MULTIVITAMIN ADULTS 50+) TABS Take by mouth daily.     OneTouch Delica Lancets 33G MISC 1 Units by Does not apply route daily. Use 1 a day to check blood sugar Dx Code E11.49 100 each 4   ONETOUCH VERIO test strip USE TO CHECK BLOOD SUGAR ONCE A DAY. DX CODE E11.49 50 strip 9   vitamin E 180 MG (400 UNITS) capsule Take 400 Units by mouth daily.     No current facility-administered medications on file prior to visit.    Allergies  Allergen Reactions   Bee Venom Swelling   Sulfamethoxazole-Trimethoprim     REACTION: tongue swells   Latex Rash   Tdap [Tetanus-Diphth-Acell Pertussis] Rash    Past  Medical History:  Diagnosis Date   Allergy    Back pain    Back injections at [facility] for back pain &  spinal stenosis   Barrett's esophagus    Carpal tunnel syndrome    Diabetes mellitus    Diverticulitis    DVT (deep venous thrombosis) (HCC)    hx of 1998 post knee replacement   Hemorrhoids    Hiatal hernia    History of nephrolithiasis    Osteoarthritis, knee    Uterine fibroids affecting pregnancy     Past Surgical History:  Procedure Laterality Date   CARPAL TUNNEL RELEASE Right    LAPAROSCOPY     TOTAL KNEE ARTHROPLASTY     right    Family History  Problem Relation Age of Onset   Cancer Sister    Diabetes Son    Heart disease Son        cardiomyopathy    Social History   Socioeconomic History   Marital status: Legally Separated    Spouse name: Not on file   Number of children: 3   Years of education: Not on file   Highest education level: Bachelor's degree (e.g., BA, AB, BS)  Occupational History   Occupation: Veterinary surgeon, Astronomer    Comment: retired   Occupation: [employer]  Comment: retired  Tobacco Use   Smoking status: Former    Passive exposure: Never   Smokeless tobacco: Never   Tobacco comments:    quit 1975  Substance and Sexual Activity   Alcohol use: No   Drug use: No   Sexual activity: Not Currently    Birth control/protection: Post-menopausal  Other Topics Concern   Not on file  Social History Narrative   No living will   Daughter should be health care POA Abran Duke)   Would accept resuscitation   Would probably accept tube feeds   Social Determinants of Health   Financial Resource Strain: Low Risk  (01/26/2023)   Overall Financial Resource Strain (CARDIA)    Difficulty of Paying Living Expenses: Not hard at all  Food Insecurity: No Food Insecurity (01/26/2023)   Hunger Vital Sign    Worried About Running Out of Food in the Last Year: Never true    Ran Out of Food in the Last Year: Never true  Transportation  Needs: No Transportation Needs (01/26/2023)   PRAPARE - Administrator, Civil Service (Medical): No    Lack of Transportation (Non-Medical): No  Physical Activity: Sufficiently Active (01/26/2023)   Exercise Vital Sign    Days of Exercise per Week: 3 days    Minutes of Exercise per Session: 70 min  Stress: Stress Concern Present (01/26/2023)   [employer] of Occupational Health - Occupational Stress Questionnaire    Feeling of Stress : Rather much  Social Connections: Moderately Integrated (01/26/2023)   Social Connection and Isolation Panel [NHANES]    Frequency of Communication with Friends and Family: More than three times a week    Frequency of Social Gatherings with Friends and Family: More than three times a week    Attends Religious Services: More than 4 times per year    Active Member of [employer] or Organizations: Yes    Attends Engineer, structural: More than 4 times per year    Marital Status: Separated  Intimate Partner Violence: Not on file   Review of Systems Not sleeping well Bowels are fine    Objective:   Physical Exam Constitutional:      Appearance: Normal appearance.  HENT:     Ears:     Comments: Left TM appears abnormal but not inflamed Cardiovascular:     Rate and Rhythm: Normal rate and regular rhythm.     Pulses: Normal pulses.     Heart sounds: No murmur heard.    No gallop.  Pulmonary:     Effort: Pulmonary effort is normal.     Breath sounds: Normal breath sounds. No wheezing or rales.  Musculoskeletal:     Cervical back: Neck supple.  Lymphadenopathy:     Cervical: No cervical adenopathy.  Neurological:     Mental Status: She is alert.  Psychiatric:        Mood and Affect: Mood normal.        Behavior: Behavior normal.            Assessment & Plan:

## 2023-04-30 NOTE — Addendum Note (Signed)
Addended by: Tillman Abide I on: 04/30/2023 11:40 AM   Modules accepted: Orders

## 2023-04-30 NOTE — Progress Notes (Signed)
Hearing Screening - Comments:: Passed whisper test Vision Screening - Comments:: December 2023  

## 2023-04-30 NOTE — Assessment & Plan Note (Addendum)
Got concerned about side effects with jardiance (though didn't have any) so stopped Back to just the metformin 500mg  daily Will check A1c--slightly worse at 8.1%  Discussed options ---Heather Shaffer is willing to increase the metformin to 500 bid

## 2023-04-30 NOTE — Assessment & Plan Note (Signed)
And tinnitus Will refer to ENT---not clear if it could be conductive (if TM abnormal) or other (??Meniere's)

## 2023-06-11 ENCOUNTER — Encounter: Payer: Self-pay | Admitting: Internal Medicine

## 2023-06-15 ENCOUNTER — Encounter: Payer: Self-pay | Admitting: Emergency Medicine

## 2023-06-15 ENCOUNTER — Ambulatory Visit
Admission: EM | Admit: 2023-06-15 | Discharge: 2023-06-15 | Disposition: A | Discharge status: Home / Self Care | Payer: Medicare Other

## 2023-06-15 DIAGNOSIS — H9202 Otalgia, left ear: Secondary | ICD-10-CM | POA: Diagnosis not present

## 2023-06-15 DIAGNOSIS — R42 Dizziness and giddiness: Secondary | ICD-10-CM

## 2023-06-15 NOTE — ED Provider Notes (Signed)
[facility]    CSN: [id number] Arrival date & time: 06/15/23  1447      History   Chief Complaint Chief Complaint  Patient presents with   Otalgia    HPI Heather Shaffer is a 72 y.o. female.   Patient here today for evaluation of left ear pain and now vertigo that started after she was bit by placed on doxycycline.  She is concerned that the doxycycline caused symptoms.  She notes vertigo has just recently started.  She notes some sore throat last night but this has improved.  She was referred to ENT but had to miss appointment due to her mother's death.  She has not been able to see specialist since referred by PCP.  The history is provided by the patient.    Past Medical History:  Diagnosis Date   Allergy    Back pain    Back injections at [facility] for back pain &  spinal stenosis   Barrett's esophagus    Carpal tunnel syndrome    Diabetes mellitus    Diverticulitis    DVT (deep venous thrombosis) (HCC)    hx of 1998 post knee replacement   Hemorrhoids    Hiatal hernia    History of nephrolithiasis    Osteoarthritis, knee    Uterine fibroids affecting pregnancy     Patient Active Problem List   Diagnosis Date Noted   Hearing loss 04/30/2023   Tick bite of neck 03/24/2023   Tinnitus of left ear 03/24/2023   Stage 3a chronic kidney disease (HCC) 01/22/2022   Mood disorder (HCC) 04/13/2020   Chest pain 04/05/2020   Diabetic Charcot foot (HCC) 02/16/2019   Radiculopathy, lumbar region 02/29/2016   Carpal tunnel syndrome 10/19/2015   Advance directive discussed with patient 10/19/2015   Peripheral neuropathy 03/01/2015   Osteoarthritis, knee 01/22/2012   Routine general medical examination at a health care facility 06/17/2011   GERD without esophagitis 02/01/2008   NEPHROLITHIASIS, HX OF 02/01/2008   Type 2 diabetes mellitus with neurological manifestations, controlled (HCC) 01/28/2008   Spinal stenosis of lumbar region with radiculopathy 01/28/2008     Past Surgical History:  Procedure Laterality Date   CARPAL TUNNEL RELEASE Right    LAPAROSCOPY     TOTAL KNEE ARTHROPLASTY     right    OB History     Gravida  6   Para  3   Term  3   Preterm      AB  3   Living  3      SAB  3   IAB      Ectopic      Multiple      Live Births  3            Home Medications    Prior to Admission medications   Medication Sig Start Date End Date Taking? Authorizing Provider  Apple Cider Vinegar 600 MG CAPS Take by mouth daily.   Yes [provider]  B Complex-C (SUPER B COMPLEX PO) Take by mouth daily. Plus electrolytes   Yes [provider]  Cholecalciferol (VITAMIN D3) 25 MCG (1000 UT) CAPS Take by mouth daily.   Yes [provider]  CINNAMON PO Take 1,000 mg by mouth daily.   Yes [provider]  Cyanocobalamin (VITAMIN B-12) 5000 MCG SUBL Place under the tongue daily.   Yes [provider]  metFORMIN (GLUCOPHAGE-XR) 500 MG 24 hr tablet Take 2 tablets (1,000 mg total) by  mouth 2 (two) times daily with a meal. 04/30/23  Yes Karie Schwalbe, MD  Multiple Vitamins-Minerals (HAIR SKIN AND NAILS FORMULA PO) Take by mouth.   Yes [provider]  Multiple Vitamins-Minerals (MULTIVITAMIN ADULTS 50+) TABS Take by mouth daily.   Yes [provider]  OneTouch Delica Lancets 33G MISC 1 Units by Does not apply route daily. Use 1 a day to check blood sugar Dx Code E11.49 04/05/20  Yes Karie Schwalbe, MD  [facility] VERIO test strip USE TO CHECK BLOOD SUGAR ONCE A DAY. DX CODE E11.49 03/24/23  Yes Karie Schwalbe, MD  vitamin E 180 MG (400 UNITS) capsule Take 400 Units by mouth daily.   Yes [provider]    Family History Family History  Problem Relation Age of Onset   Cancer Sister    Diabetes Son    Heart disease Son        cardiomyopathy    Social History Social History   Tobacco Use   Smoking status: Former    Passive exposure: Never    Smokeless tobacco: Never   Tobacco comments:    quit 1975  Vaping Use   Vaping status: Never Used  Substance Use Topics   Alcohol use: No   Drug use: No     Allergies   Bee venom, Sulfamethoxazole-trimethoprim, Latex, and Tdap [tetanus-diphth-acell pertussis]   Review of Systems Review of Systems  Constitutional:  Negative for chills and fever.  HENT:  Positive for ear pain. Negative for congestion.   Eyes:  Negative for discharge and redness.  Respiratory:  Negative for shortness of breath.   Gastrointestinal:  Negative for nausea and vomiting.  Neurological:  Positive for dizziness.     Physical Exam Triage Vital Signs ED Triage Vitals  Encounter Vitals Group     BP      Systolic BP Percentile      Diastolic BP Percentile      Pulse      Resp      Temp      Temp src      SpO2      Weight      Height      Head Circumference      Peak Flow      Pain Score      Pain Loc      Pain Education      Exclude from Growth Chart    No data found.  Updated Vital Signs BP (!) 149/82 (BP Location: Left Arm)   Pulse 70   Temp 98.3 F (36.8 C) (Oral)   Resp 16   Ht 5\' 3"  (1.6 m)   Wt 174 lb (78.9 kg)   SpO2 97%   BMI 30.82 kg/m      Physical Exam Vitals and nursing note reviewed.  Constitutional:      General: She is not in acute distress.    Appearance: Normal appearance. She is not ill-appearing.  HENT:     Head: Normocephalic and atraumatic.     Right Ear: Tympanic membrane and ear canal normal.     Left Ear: Tympanic membrane and ear canal normal.     Nose: Nose normal. No congestion or rhinorrhea.     Mouth/Throat:     Mouth: Mucous membranes are moist.     Pharynx: No oropharyngeal exudate or posterior oropharyngeal erythema.  Eyes:     Conjunctiva/sclera: Conjunctivae normal.  Cardiovascular:     Rate and Rhythm: Normal  rate.  Pulmonary:     Effort: Pulmonary effort is normal. No respiratory distress.  Neurological:     Mental Status: She is  alert.  Psychiatric:        Mood and Affect: Mood normal.        Behavior: Behavior normal.        Thought Content: Thought content normal.      UC Treatments / Results  Labs (all labs ordered are listed, but only abnormal results are displayed) Labs Reviewed - No data to display  EKG   Radiology No results found.  Procedures Procedures (including critical care time)  Medications Ordered in UC Medications - No data to display  Initial Impression / Assessment and Plan / UC Course  I have reviewed the triage vital signs and the nursing notes.  Pertinent labs & imaging results that were available during my care of the patient were reviewed by me and considered in my medical decision making (see chart for details).    Discussed possible treatment for vertigo including meclizine but patient would like to decline this at this time due to concerns for possible side effects.  Recommended follow-up with ENT and contact information provided.  Recommend sooner follow-up with any worsening symptoms or further concerns.  Final Clinical Impressions(s) / UC Diagnoses   Final diagnoses:  Discomfort of left ear  Vertigo   Discharge Instructions   None    ED Prescriptions   None    PDMP not reviewed this encounter.   Tomi Bamberger, PA-C 06/15/23 1549

## 2023-06-15 NOTE — ED Triage Notes (Signed)
Patient c/o left ear pain for a while, since before being bit by a tick.  Patient's PCP was placed on doxycycline completed course.  PCP referred to ENT, unable to make an appt due to mom passing away.  Patient feels unsteady when standing, some throat discomfort.

## 2023-06-20 ENCOUNTER — Ambulatory Visit: Payer: Medicare Other | Admitting: Family Medicine

## 2023-06-20 ENCOUNTER — Telehealth: Payer: Self-pay | Admitting: Internal Medicine

## 2023-06-20 ENCOUNTER — Encounter: Payer: Self-pay | Admitting: *Deleted

## 2023-06-20 ENCOUNTER — Encounter: Payer: Self-pay | Admitting: Family Medicine

## 2023-06-20 VITALS — BP 120/76 | HR 60 | Temp 97.3°F | Ht 63.0 in | Wt 179.4 lb

## 2023-06-20 DIAGNOSIS — E1140 Type 2 diabetes mellitus with diabetic neuropathy, unspecified: Secondary | ICD-10-CM

## 2023-06-20 DIAGNOSIS — J020 Streptococcal pharyngitis: Secondary | ICD-10-CM | POA: Diagnosis not present

## 2023-06-20 DIAGNOSIS — H9202 Otalgia, left ear: Secondary | ICD-10-CM | POA: Diagnosis not present

## 2023-06-20 DIAGNOSIS — H9312 Tinnitus, left ear: Secondary | ICD-10-CM | POA: Diagnosis not present

## 2023-06-20 DIAGNOSIS — J029 Acute pharyngitis, unspecified: Secondary | ICD-10-CM

## 2023-06-20 DIAGNOSIS — Z7984 Long term (current) use of oral hypoglycemic drugs: Secondary | ICD-10-CM | POA: Diagnosis not present

## 2023-06-20 DIAGNOSIS — H9192 Unspecified hearing loss, left ear: Secondary | ICD-10-CM | POA: Diagnosis not present

## 2023-06-20 LAB — POCT RAPID STREP A (OFFICE): Rapid Strep A Screen: POSITIVE — AB

## 2023-06-20 MED ORDER — AMOXICILLIN-POT CLAVULANATE 875-125 MG PO TABS: 1.0000 | ORAL_TABLET | Freq: Two times a day (BID) | ORAL | 0 refills | Status: AC

## 2023-06-20 NOTE — Assessment & Plan Note (Signed)
L cerumen impaction covering TM.  Hearing loss also noted. Will Rx augmentin course to cover acute otitis media.  Has been unable to get in to see ENT - new referral placed per pt request to [facility] in [place].

## 2023-06-20 NOTE — Telephone Encounter (Signed)
Patient called the office regarding appointment from today. Patient mentioned she would like to transfer from Dr. Alphonsus Sias to Dr. Sharen Hones since Dr. Alphonsus Sias will be retiring. Patient has seen Dr. Reece Agar in the past and would like to see him going forward, okay per both providers?

## 2023-06-20 NOTE — Progress Notes (Signed)
Ph: [phone] Fax: [phone]   Patient ID: Heather Shaffer, female    DOB: 1950/12/03, 72 y.o.   MRN: [id number]  This visit was conducted in person.  BP 120/76   Pulse 60   Temp (!) 97.3 F (36.3 C) (Temporal)   Ht 5\' 3"  (1.6 m)   Wt 179 lb 6 oz (81.4 kg)   SpO2 98%   BMI 31.77 kg/m   Hearing Screening   500Hz  1000Hz  2000Hz  4000Hz   Right ear 20 40 20 25  Left ear 0 0 40 0    CC: L ear pain  Subjective:   HPI: [name] is a 72 y.o. female presenting on 06/20/2023 for Ear Pain (C/o L ear pain, buzzing sound in ear and ST. Started about 2 wks ago. )   L ear pain x 2 wks aching throbbing associated with sore throat and buzzing tinnitus in ear. L>R hearing loss. PNdrianage. Worsening dizziness described as unsteady with position changes. No vertigo, no alcohol use.   No fevers/chills, tooth pain, cough, congestion, nausea, diarrhea, abd pain or anorexia.  No sick contacts at home.   Brings picture of throat from yesterday - enlarged tonsil on left with discharge.   Seen at [facility] last week - for L ear pain and vertigo. Offered some mezlicine but pt declined.   Saw Dr Alphonsus Sias back in 04/2023 with hearing loss - has been trying to get in with ENT since then. Requests referral to new office.   DM - on metformin XR 500mg  with dinner although med list says 1000mg  bid - she confirms she's only taking 1 XR 500mg  tablet daily - sugars remaining uncontrolled for the past few months (200-300s).  Lab Results  Component Value Date   HGBA1C 7.9 (A) 01/29/2023   She states she is planning to transition to me in the future.      Relevant past medical, surgical, family and social history reviewed and updated as indicated. Interim medical history since our last visit reviewed. Allergies and medications reviewed and updated. Outpatient Medications Prior to Visit  Medication Sig Dispense Refill   Apple Cider Vinegar 600 MG CAPS Take by mouth daily.     B Complex-C (SUPER B  COMPLEX PO) Take by mouth daily. Plus electrolytes     Cholecalciferol (VITAMIN D3) 25 MCG (1000 UT) CAPS Take by mouth daily.     CINNAMON PO Take 1,000 mg by mouth daily.     Cyanocobalamin (VITAMIN B-12) 5000 MCG SUBL Place under the tongue daily.     Multiple Vitamins-Minerals (HAIR SKIN AND NAILS FORMULA PO) Take by mouth.     Multiple Vitamins-Minerals (MULTIVITAMIN ADULTS 50+) TABS Take by mouth daily.     OneTouch Delica Lancets 33G MISC 1 Units by Does not apply route daily. Use 1 a day to check blood sugar Dx Code E11.49 100 each 4   ONETOUCH VERIO test strip USE TO CHECK BLOOD SUGAR ONCE A DAY. DX CODE E11.49 50 strip 9   vitamin E 180 MG (400 UNITS) capsule Take 400 Units by mouth daily.     metFORMIN (GLUCOPHAGE-XR) 500 MG 24 hr tablet Take 2 tablets (1,000 mg total) by mouth 2 (two) times daily with a meal. 1 tablet 0   metFORMIN (GLUCOPHAGE-XR) 500 MG 24 hr tablet Take 2 tablets (1,000 mg total) by mouth daily with supper.     No facility-administered medications prior to visit.     Per HPI unless specifically indicated in ROS section below  Review of Systems  Objective:  BP 120/76   Pulse 60   Temp (!) 97.3 F (36.3 C) (Temporal)   Ht 5\' 3"  (1.6 m)   Wt 179 lb 6 oz (81.4 kg)   SpO2 98%   BMI 31.77 kg/m   Wt Readings from Last 3 Encounters:  06/20/23 179 lb 6 oz (81.4 kg)  06/15/23 174 lb (78.9 kg)  04/30/23 185 lb (83.9 kg)      Physical Exam Vitals and nursing note reviewed.  Constitutional:      Appearance: Normal appearance. She is not ill-appearing.  HENT:     Head: Normocephalic and atraumatic.     Right Ear: Tympanic membrane, ear canal and external ear normal. There is no impacted cerumen.     Left Ear: Ear canal and external ear normal. Decreased hearing noted. There is impacted cerumen.     Ears:     Comments: Cerumen impaction on left, unable to visualize TM    Nose: No congestion or rhinorrhea.     Mouth/Throat:     Mouth: Mucous membranes are  moist.     Pharynx: Posterior oropharyngeal erythema present.     Tonsils: No tonsillar exudate. 0 on the right. 1+ on the left.     Comments: L tonsillar erythema without significant drainage Eyes:     Extraocular Movements: Extraocular movements intact.     Conjunctiva/sclera: Conjunctivae normal.     Pupils: Pupils are equal, round, and reactive to light.  Cardiovascular:     Rate and Rhythm: Normal rate and regular rhythm.     Pulses: Normal pulses.     Heart sounds: Normal heart sounds. No murmur heard. Pulmonary:     Effort: Pulmonary effort is normal. No respiratory distress.     Breath sounds: Normal breath sounds. No wheezing, rhonchi or rales.  Lymphadenopathy:     Head:     Right side of head: No submental, submandibular, tonsillar, preauricular or posterior auricular adenopathy.     Left side of head: No submental, submandibular, tonsillar, preauricular or posterior auricular adenopathy.     Cervical: No cervical adenopathy.     Right cervical: No superficial cervical adenopathy.    Left cervical: No superficial cervical adenopathy.     Upper Body:     Right upper body: No supraclavicular adenopathy.     Left upper body: No supraclavicular adenopathy.  Skin:    Findings: No rash.  Neurological:     Mental Status: She is alert.  Psychiatric:        Mood and Affect: Mood normal.        Behavior: Behavior normal.       Results for orders placed or performed in visit on 06/20/23  POCT rapid strep A  Result Value Ref Range   Rapid Strep A Screen Positive (A) Negative   Lab Results  Component Value Date   NA 140 07/31/2022   CL 102 07/31/2022   K 4.3 07/31/2022   CO2 32 07/31/2022   BUN 22 07/31/2022   CREATININE 1.00 07/31/2022   GFR 56.54 (L) 07/31/2022   CALCIUM 9.9 07/31/2022   PHOS 4.1 07/31/2022   ALBUMIN 4.2 07/31/2022   ALBUMIN 4.2 07/31/2022   GLUCOSE 119 (H) 07/31/2022   Assessment & Plan:   Problem List Items Addressed This Visit     Type 2  diabetes mellitus with neurologic complication, without long-term current use of insulin (HCC)    She's only been taking metformin XR 500mg  once  daily although med list had 1000mg  BID dosing. She notes ongoing marked hyperglycemia to high 200s - I have asked her to increase metformin XR to 1000mg  daily. I've asked her to return within the month for DM f/u visit with myself or PCP.       Relevant Medications   metFORMIN (GLUCOPHAGE-XR) 500 MG 24 hr tablet   Tinnitus of left ear   Relevant Orders   Ambulatory referral to ENT   Hearing loss   Relevant Orders   Ambulatory referral to ENT   Acute ear pain, left - Primary    L cerumen impaction covering TM.  Hearing loss also noted. Will Rx augmentin course to cover acute otitis media.  Has been unable to get in to see ENT - new referral placed per pt request to [facility] in [place].       Relevant Orders   Ambulatory referral to ENT   Strep pharyngitis    RST positive. Rx augmentin course - augmentin chosen to cover possible R AOM as per below.  Update if not improving with treatment.       Other Visit Diagnoses     Sore throat       Relevant Orders   POCT rapid strep A (Completed)        Meds ordered this encounter  Medications   amoxicillin-clavulanate (AUGMENTIN) 875-125 MG tablet    Sig: Take 1 tablet by mouth 2 (two) times daily for 10 days.    Dispense:  20 tablet    Refill:  0    Orders Placed This Encounter  Procedures   Ambulatory referral to ENT    Referral Priority:   Routine    Referral Type:   Consultation    Referral Reason:   Specialty Services Required    Requested Specialty:   Otolaryngology    Number of Visits Requested:   1   POCT rapid strep A    Patient Instructions  Hearing screen today.  Strep test returned positive.  I want to treat for possible ear infection with augmentin 10 day course. Push fluids and rest.  I will try to get you set up with [facility]  Schedule follow up  appointment with myself or Dr Alphonsus Sias for diabetes follow up in the next few weeks.    Increase metformin to 2 tablets daily with dinner.   Follow up plan: Return if symptoms worsen or fail to improve.  Eustaquio Boyden, MD

## 2023-06-20 NOTE — Assessment & Plan Note (Signed)
She's only been taking metformin XR 500mg  once daily although med list had 1000mg  BID dosing. She notes ongoing marked hyperglycemia to high 200s - I have asked her to increase metformin XR to 1000mg  daily. I've asked her to return within the month for DM f/u visit with myself or PCP.

## 2023-06-20 NOTE — Telephone Encounter (Signed)
She mentioned this at OV today.  Ok by me if ok by Dr Alphonsus Sias.

## 2023-06-20 NOTE — Assessment & Plan Note (Signed)
RST positive. Rx augmentin course - augmentin chosen to cover possible R AOM as per below.  Update if not improving with treatment.

## 2023-06-20 NOTE — Patient Instructions (Addendum)
Hearing screen today.  Strep test returned positive.  I want to treat for possible ear infection with augmentin 10 day course. Push fluids and rest.  I will try to get you set up with [facility]  Schedule follow up appointment with myself or Dr Alphonsus Sias for diabetes follow up in the next few weeks.    Increase metformin to 2 tablets daily with dinner.

## 2023-06-24 DIAGNOSIS — H60339 Swimmer's ear, unspecified ear: Secondary | ICD-10-CM | POA: Diagnosis not present

## 2023-06-24 DIAGNOSIS — T162XXA Foreign body in left ear, initial encounter: Secondary | ICD-10-CM | POA: Diagnosis not present

## 2023-06-30 NOTE — Telephone Encounter (Signed)
Appointment scheduled. Thanks to both providers

## 2023-07-04 NOTE — Telephone Encounter (Signed)
Sorry for the delay. This has been taken care of.  See referral notes.   Patient requested the referral be re-routed to [facility] in [place].   Pt was seen by ENT 06/24/2023 @ 2:30p with Dr Andee Poles.   Nothing further needed.

## 2023-07-08 ENCOUNTER — Ambulatory Visit (INDEPENDENT_AMBULATORY_CARE_PROVIDER_SITE_OTHER): Payer: Medicare Other | Admitting: Family Medicine

## 2023-07-08 ENCOUNTER — Encounter: Payer: Self-pay | Admitting: Family Medicine

## 2023-07-08 VITALS — BP 136/82 | HR 66 | Temp 97.3°F | Ht 63.0 in | Wt 179.0 lb

## 2023-07-08 DIAGNOSIS — E1140 Type 2 diabetes mellitus with diabetic neuropathy, unspecified: Secondary | ICD-10-CM | POA: Diagnosis not present

## 2023-07-08 DIAGNOSIS — E11610 Type 2 diabetes mellitus with diabetic neuropathic arthropathy: Secondary | ICD-10-CM | POA: Diagnosis not present

## 2023-07-08 DIAGNOSIS — H9192 Unspecified hearing loss, left ear: Secondary | ICD-10-CM | POA: Diagnosis not present

## 2023-07-08 DIAGNOSIS — Z7984 Long term (current) use of oral hypoglycemic drugs: Secondary | ICD-10-CM

## 2023-07-08 DIAGNOSIS — Z23 Encounter for immunization: Secondary | ICD-10-CM

## 2023-07-08 LAB — POCT GLYCOSYLATED HEMOGLOBIN (HGB A1C): Hemoglobin A1C: 9.2 % — AB (ref 4.0–5.6)

## 2023-07-08 NOTE — Assessment & Plan Note (Signed)
She notes hearing loss persists.  Hearing screen actually improved from last visit. I encouraged she finish ear drops recently prescribed by ENT and if ongoing hearing loss to f/u with Dr Alessandra Bevels.

## 2023-07-08 NOTE — Patient Instructions (Addendum)
Flu shot today  We will request latest ENT office note.  Continue metformin XR 500mg  1 tablet twice daily. Continue working on low sugar low carb diabetic diet, limit sweetened beverages.  Return in October for physical, schedule fasting lab visit 1 week prior (4 hour fasting).

## 2023-07-08 NOTE — Assessment & Plan Note (Signed)
Chronic, deteriorated since last check correlating to recent 200-300s on glucometer. She notes since increasing metformin XR to 2 tablets daily, cbg's now running 100-200, lowest to 85 (felt bad with this).  She will continue current regimen of metformin XR 500mg  BID, encourage renewing efforts to follow low sugar low carb diabetic diet.  Will reassess control at physical in October with fructosamine and further labwork.

## 2023-07-08 NOTE — Assessment & Plan Note (Signed)
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

## 2023-07-08 NOTE — Progress Notes (Signed)
Ph: [phone] Fax: [phone]   Patient ID: Heather Shaffer, female    DOB: 08-10-1951, 72 y.o.   MRN: [id number]  This visit was conducted in person.  BP 136/82   Pulse 66   Temp (!) 97.3 F (36.3 C) (Temporal)   Ht 5\' 3"  (1.6 m)   Wt 179 lb (81.2 kg)   SpO2 99%   BMI 31.71 kg/m   Hearing Screening   500Hz  1000Hz  2000Hz  4000Hz   Right ear 20 40 20 40  Left ear 20 40 20 0    CC: transfer care from Dr Alphonsus Sias Subjective:   HPI: Heather Shaffer is a 72 y.o. female presenting on 07/08/2023 for Medical Management of Chronic Issues (Here TOC from Dr. Alphonsus Sias Clementeen Hoof 06/20/23 phn note] and DM f/u.)   Transfer of care from Dr Alphonsus Sias. Last physical was 07/2022.  Stressful period - many deaths in family - mother 01/2023, MIL 02/2023, stress with sibling interactions around this time.   Seen here 2 weeks ago with strep throat and acute L ear pain (with cerumen impaction covering TM and hearing loss) treated with augmentin course with benefit. She is feeling better from strep standpoint. New referral placed to [facility] - seen last week, cotton ball removed from left ear, placed on ear drops for 10 days. Throat still looks discolored, somewhat sore.   DM - does regularly check sugars with glucometer. Compliant with antihyperglycemic regimen which includes: metformin XR 1000mg  daily (this was increased from 500mg  daily 2 wks ago). Rare hypoglycemic symptoms with cbg's 80s. Denies paresthesias, occasional blurry vision. Last diabetic eye exam 10/2022. Glucometer brand: one touch verio. Last foot exam: 07/2022. DSME: completed remotely. She already limits sugars and carbs.  Lab Results  Component Value Date   HGBA1C 9.2 (A) 07/08/2023   Diabetic Foot Exam - Simple   Simple Foot Form Diabetic Foot exam was performed with the following findings: Yes 07/08/2023 11:54 AM  Visual Inspection See comments: Yes Sensation Testing See comments: Yes Pulse Check Posterior Tibialis and Dorsalis  pulse intact bilaterally: Yes Comments No claudication Charcot foot changes on R - with collapse of longitudinal arch and callus formation Diminished sensation to monofilament testing of left sole    Lab Results  Component Value Date   MICROALBUR <0.7 07/31/2022         Relevant past medical, surgical, family and social history reviewed and updated as indicated. Interim medical history since our last visit reviewed. Allergies and medications reviewed and updated. Outpatient Medications Prior to Visit  Medication Sig Dispense Refill   Apple Cider Vinegar 600 MG CAPS Take by mouth daily.     B Complex-C (SUPER B COMPLEX PO) Take by mouth daily. Plus electrolytes     Cholecalciferol (VITAMIN D3) 25 MCG (1000 UT) CAPS Take by mouth daily.     CINNAMON PO Take 1,000 mg by mouth daily.     Cyanocobalamin (VITAMIN B-12) 5000 MCG SUBL Place under the tongue daily.     metFORMIN (GLUCOPHAGE-XR) 500 MG 24 hr tablet Take 2 tablets (1,000 mg total) by mouth daily with supper.     Multiple Vitamins-Minerals (HAIR SKIN AND NAILS FORMULA PO) Take by mouth.     Multiple Vitamins-Minerals (MULTIVITAMIN ADULTS 50+) TABS Take by mouth daily.     OneTouch Delica Lancets 33G MISC 1 Units by Does not apply route daily. Use 1 a day to check blood sugar Dx Code E11.49 100 each 4   ONETOUCH VERIO test strip USE TO  CHECK BLOOD SUGAR ONCE A DAY. DX CODE E11.49 50 strip 9   vitamin E 180 MG (400 UNITS) capsule Take 400 Units by mouth daily.     No facility-administered medications prior to visit.     Per HPI unless specifically indicated in ROS section below Review of Systems  Objective:  BP 136/82   Pulse 66   Temp (!) 97.3 F (36.3 C) (Temporal)   Ht 5\' 3"  (1.6 m)   Wt 179 lb (81.2 kg)   SpO2 99%   BMI 31.71 kg/m   Wt Readings from Last 3 Encounters:  07/08/23 179 lb (81.2 kg)  06/20/23 179 lb 6 oz (81.4 kg)  06/15/23 174 lb (78.9 kg)      Physical Exam Vitals and nursing note reviewed.   Constitutional:      Appearance: Normal appearance. She is not ill-appearing.  HENT:     Head: Normocephalic and atraumatic.     Right Ear: Tympanic membrane, ear canal and external ear normal. There is no impacted cerumen.     Left Ear: Tympanic membrane, ear canal and external ear normal. There is no impacted cerumen.     Mouth/Throat:     Mouth: Mucous membranes are moist.     Pharynx: Oropharynx is clear. No oropharyngeal exudate or posterior oropharyngeal erythema.  Eyes:     Extraocular Movements: Extraocular movements intact.     Conjunctiva/sclera: Conjunctivae normal.     Pupils: Pupils are equal, round, and reactive to light.  Cardiovascular:     Rate and Rhythm: Normal rate and regular rhythm.     Pulses: Normal pulses.     Heart sounds: Normal heart sounds. No murmur heard. Pulmonary:     Effort: Pulmonary effort is normal. No respiratory distress.     Breath sounds: Normal breath sounds. No wheezing, rhonchi or rales.  Musculoskeletal:        General: Deformity present.     Cervical back: Normal range of motion and neck supple.     Right lower leg: No edema.     Left lower leg: No edema.     Comments: See HPI for foot exam if done  Lymphadenopathy:     Cervical: No cervical adenopathy.  Skin:    General: Skin is warm and dry.     Findings: No rash.  Neurological:     Mental Status: She is alert.  Psychiatric:        Mood and Affect: Mood normal.        Behavior: Behavior normal.       Results for orders placed or performed in visit on 07/08/23  POCT glycosylated hemoglobin (Hb A1C)  Result Value Ref Range   Hemoglobin A1C 9.2 (A) 4.0 - 5.6 %   HbA1c POC (<> result, manual entry)     HbA1c, POC (prediabetic range)     HbA1c, POC (controlled diabetic range)      Assessment & Plan:   Problem List Items Addressed This Visit     Type 2 diabetes mellitus with neurologic complication, without long-term current use of insulin (HCC) - Primary    Chronic,  deteriorated since last check correlating to recent 200-300s on glucometer. She notes since increasing metformin XR to 2 tablets daily, cbg's now running 100-200, lowest to 85 (felt bad with this).  She will continue current regimen of metformin XR 500mg  BID, encourage renewing efforts to follow low sugar low carb diabetic diet.  Will reassess control at physical in October with  fructosamine and further labwork.      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  Hearing screen actually improved from last visit. I encouraged she finish ear drops recently prescribed by ENT and if ongoing hearing loss to f/u with Dr Alessandra Bevels.       Other Visit Diagnoses     Encounter for immunization       Relevant Orders   Flu Vaccine Trivalent High Dose (Fluad) (Completed)        No orders of the defined types were placed in this encounter.   Orders Placed This Encounter  Procedures   Flu Vaccine Trivalent High Dose (Fluad)   POCT glycosylated hemoglobin (Hb A1C)    Patient Instructions  Flu shot today  We will request latest ENT office note.  Continue metformin XR 500mg  1 tablet twice daily. Continue working on low sugar low carb diabetic diet, limit sweetened beverages.  Return in October for physical, schedule fasting lab visit 1 week prior (4 hour fasting).   Follow up plan: Return if symptoms worsen or fail to improve.  Eustaquio Boyden, MD

## 2023-07-10 ENCOUNTER — Other Ambulatory Visit (INDEPENDENT_AMBULATORY_CARE_PROVIDER_SITE_OTHER): Payer: Medicare Other

## 2023-07-10 ENCOUNTER — Other Ambulatory Visit: Payer: Self-pay | Admitting: Family Medicine

## 2023-07-10 DIAGNOSIS — N1831 Chronic kidney disease, stage 3a: Secondary | ICD-10-CM

## 2023-07-10 DIAGNOSIS — E1140 Type 2 diabetes mellitus with diabetic neuropathy, unspecified: Secondary | ICD-10-CM

## 2023-07-10 LAB — MICROALBUMIN / CREATININE URINE RATIO
Creatinine,U: 110 mg/dL
Microalb Creat Ratio: 0.6 mg/g (ref 0.0–30.0)
Microalb, Ur: <0.7 mg/dL (ref 0.0–1.9)

## 2023-07-10 LAB — CBC WITH DIFFERENTIAL/PLATELET
Basophils Absolute: 0.1 10*3/uL (ref 0.0–0.1)
Basophils Relative: 2.2 % (ref 0.0–3.0)
Eosinophils Absolute: 0.2 10*3/uL (ref 0.0–0.7)
Eosinophils Relative: 4.9 % (ref 0.0–5.0)
HCT: 40.1 % (ref 36.0–46.0)
Hemoglobin: 12.7 g/dL (ref 12.0–15.0)
Lymphocytes Relative: 33.6 % (ref 12.0–46.0)
Lymphs Abs: 1.4 10*3/uL (ref 0.7–4.0)
MCHC: 31.7 g/dL (ref 30.0–36.0)
MCV: 86 fl (ref 78.0–100.0)
Monocytes Absolute: 0.5 10*3/uL (ref 0.1–1.0)
Monocytes Relative: 11.5 % (ref 3.0–12.0)
Neutro Abs: 2 10*3/uL (ref 1.4–7.7)
Neutrophils Relative %: 47.8 % (ref 43.0–77.0)
Platelets: 208 10*3/uL (ref 150.0–400.0)
RBC: 4.66 Mil/uL (ref 3.87–5.11)
RDW: 13.8 % (ref 11.5–15.5)
WBC: 4.1 10*3/uL (ref 4.0–10.5)

## 2023-07-10 LAB — COMPREHENSIVE METABOLIC PANEL
ALT: 13 U/L (ref 0–35)
AST: 17 U/L (ref 0–37)
Albumin: 3.9 g/dL (ref 3.5–5.2)
Alkaline Phosphatase: 76 U/L (ref 39–117)
BUN: 18 mg/dL (ref 6–23)
CO2: 31 meq/L (ref 19–32)
Calcium: 9.9 mg/dL (ref 8.4–10.5)
Chloride: 103 meq/L (ref 96–112)
Creatinine, Ser: 1.16 mg/dL (ref 0.40–1.20)
GFR: 47 mL/min — ABNORMAL LOW (ref >60.00)
Glucose, Bld: 146 mg/dL — ABNORMAL HIGH (ref 70–99)
Potassium: 4 meq/L (ref 3.5–5.1)
Sodium: 140 meq/L (ref 135–145)
Total Bilirubin: 0.5 mg/dL (ref 0.2–1.2)
Total Protein: 7.2 g/dL (ref 6.0–8.3)

## 2023-07-10 LAB — LIPID PANEL
Cholesterol: 200 mg/dL (ref 0–200)
HDL: 71.5 mg/dL (ref >39.00)
LDL Cholesterol: 119 mg/dL — ABNORMAL HIGH (ref 0–99)
NonHDL: 128.49
Total CHOL/HDL Ratio: 3
Triglycerides: 46 mg/dL (ref 0.0–149.0)
VLDL: 9.2 mg/dL (ref 0.0–40.0)

## 2023-07-10 LAB — VITAMIN D 25 HYDROXY (VIT D DEFICIENCY, FRACTURES): VITD: 54.06 ng/mL (ref 30.00–100.00)

## 2023-07-10 LAB — VITAMIN B12: Vitamin B-12: 941 pg/mL — ABNORMAL HIGH (ref 211–911)

## 2023-07-10 NOTE — Addendum Note (Signed)
Addended by: Eustaquio Boyden on: 07/10/2023 07:58 AM   Modules accepted: Orders

## 2023-07-14 LAB — FRUCTOSAMINE: Fructosamine: 388 umol/L — ABNORMAL HIGH (ref 205–285)

## 2023-07-16 ENCOUNTER — Encounter: Payer: Self-pay | Admitting: Family Medicine

## 2023-08-05 ENCOUNTER — Encounter: Payer: Medicare Other | Admitting: Internal Medicine

## 2023-08-05 ENCOUNTER — Ambulatory Visit (INDEPENDENT_AMBULATORY_CARE_PROVIDER_SITE_OTHER): Payer: Medicare Other | Admitting: Family Medicine

## 2023-08-05 ENCOUNTER — Encounter: Payer: Self-pay | Admitting: Family Medicine

## 2023-08-05 VITALS — BP 122/74 | HR 64 | Temp 98.1°F | Ht 62.5 in | Wt 175.4 lb

## 2023-08-05 DIAGNOSIS — L608 Other nail disorders: Secondary | ICD-10-CM | POA: Diagnosis not present

## 2023-08-05 DIAGNOSIS — E1122 Type 2 diabetes mellitus with diabetic chronic kidney disease: Secondary | ICD-10-CM

## 2023-08-05 DIAGNOSIS — Z0000 Encounter for general adult medical examination without abnormal findings: Secondary | ICD-10-CM | POA: Insufficient documentation

## 2023-08-05 DIAGNOSIS — Z7189 Other specified counseling: Secondary | ICD-10-CM

## 2023-08-05 DIAGNOSIS — E1169 Type 2 diabetes mellitus with other specified complication: Secondary | ICD-10-CM

## 2023-08-05 DIAGNOSIS — E11610 Type 2 diabetes mellitus with diabetic neuropathic arthropathy: Secondary | ICD-10-CM

## 2023-08-05 DIAGNOSIS — Z7984 Long term (current) use of oral hypoglycemic drugs: Secondary | ICD-10-CM | POA: Diagnosis not present

## 2023-08-05 DIAGNOSIS — E785 Hyperlipidemia, unspecified: Secondary | ICD-10-CM | POA: Diagnosis not present

## 2023-08-05 DIAGNOSIS — E1140 Type 2 diabetes mellitus with diabetic neuropathy, unspecified: Secondary | ICD-10-CM

## 2023-08-05 DIAGNOSIS — N183 Chronic kidney disease, stage 3 unspecified: Secondary | ICD-10-CM

## 2023-08-05 MED ORDER — VITAMIN B-12 1000 MCG PO TABS: 1000.0000 ug | ORAL_TABLET | Freq: Every day | ORAL | Status: DC

## 2023-08-05 MED ORDER — METFORMIN HCL ER 500 MG PO TB24
500.0000 mg | ORAL_TABLET | Freq: Two times a day (BID) | ORAL | 4 refills | Status: DC
Start: 2023-08-05 — End: 2024-02-06

## 2023-08-05 MED ORDER — VITAMIN D3 25 MCG (1000 UT) PO CAPS: 1.0000 | ORAL_CAPSULE | Freq: Every day | ORAL | Status: AC

## 2023-08-05 MED ORDER — ATORVASTATIN CALCIUM 20 MG PO TABS: 20.0000 mg | ORAL_TABLET | Freq: Every day | ORAL | 4 refills | Status: DC

## 2023-08-05 MED ORDER — MULTIVITAMIN ADULTS 50+ PO TABS: 1.0000 | ORAL_TABLET | Freq: Every day | ORAL | Status: AC

## 2023-08-05 NOTE — Assessment & Plan Note (Signed)
Chronic, latest GFR 47. Continue to monitor.

## 2023-08-05 NOTE — Assessment & Plan Note (Addendum)
Preventative protocols reviewed and updated unless pt declined. Discussed healthy diet and lifestyle.  She will work on getting [place] info on latest mammogram dexa and colonoscopy.
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
## 2023-08-05 NOTE — Assessment & Plan Note (Signed)

## 2023-08-05 NOTE — Assessment & Plan Note (Addendum)
Advanced directive discussion - has living will. Will bring [place] a copy. Daughter Vista Sawatzky is HCPOA. Full code.

## 2023-08-05 NOTE — Assessment & Plan Note (Addendum)
Chronic, remaining uncontrolled however fasting cbg's are improving (from 200-300 to mid 100s).  Continue metformin XR 500mg  BID.  Reassess at 66mo DM f/u visit  She was hesitant for SGLT2i due to possible side effects.

## 2023-08-05 NOTE — Assessment & Plan Note (Signed)
Anticipate apparent leukonychia Heather Shaffer nails aka half and half nail syndrome.  This is a manifestation of CKD and uremia thought due to underlying nail bed vascular changes. This started about 2 months ago. Will continue to monitor.

## 2023-08-05 NOTE — Assessment & Plan Note (Signed)
Reviewed LDL goal in diabetic <100. Reviewed indication for statin in diabetic. She agrees to start atorvastatin 20mg  daily monitoring for myalgias.  The 10-year ASCVD risk score (Arnett DK, et al., 2019) is: 28.7%   Values used to calculate the score:     Age: 72 years     Sex: Female     Is Non-Hispanic African American: Yes     Diabetic: Yes     Tobacco smoker: No     Systolic Blood Pressure: 122 mmHg     Is BP treated: No     HDL Cholesterol: 71.5 mg/dL     Total Cholesterol: 200 mg/dL

## 2023-08-05 NOTE — Patient Instructions (Addendum)
Prevnar-20 - not available today.  We will request latest colonoscopy records 2022 through [facility]  Let us know where you go for mammograms, call to reschedule this. Ask for bone density scan at same time if able.  If interested, check with pharmacy about new 2 shot shingles series (shingrix).  Start atorvastatin 20mg  daily for cholesterol Continue metformin XR 500mg  twice daily.  Return in 3 months for diabetes follow up visit. Will repeat labs at that time.

## 2023-08-07 ENCOUNTER — Encounter: Payer: Medicare Other | Admitting: Internal Medicine

## 2023-08-08 DIAGNOSIS — Z1231 Encounter for screening mammogram for malignant neoplasm of breast: Secondary | ICD-10-CM | POA: Diagnosis not present

## 2023-08-08 LAB — HM MAMMOGRAPHY

## 2023-08-18 ENCOUNTER — Encounter: Payer: Self-pay | Admitting: Obstetrics and Gynecology

## 2023-11-04 DIAGNOSIS — E119 Type 2 diabetes mellitus without complications: Secondary | ICD-10-CM | POA: Diagnosis not present

## 2023-11-04 LAB — HM DIABETES EYE EXAM

## 2023-11-07 ENCOUNTER — Encounter: Payer: Self-pay | Admitting: Family Medicine

## 2023-11-07 ENCOUNTER — Ambulatory Visit (INDEPENDENT_AMBULATORY_CARE_PROVIDER_SITE_OTHER): Payer: Medicare Other | Admitting: Family Medicine

## 2023-11-07 VITALS — BP 132/88 | HR 55 | Temp 97.9°F | Ht 62.5 in | Wt 172.0 lb

## 2023-11-07 DIAGNOSIS — E1169 Type 2 diabetes mellitus with other specified complication: Secondary | ICD-10-CM | POA: Diagnosis not present

## 2023-11-07 DIAGNOSIS — E1140 Type 2 diabetes mellitus with diabetic neuropathy, unspecified: Secondary | ICD-10-CM

## 2023-11-07 DIAGNOSIS — Z7984 Long term (current) use of oral hypoglycemic drugs: Secondary | ICD-10-CM | POA: Diagnosis not present

## 2023-11-07 DIAGNOSIS — R413 Other amnesia: Secondary | ICD-10-CM | POA: Diagnosis not present

## 2023-11-07 DIAGNOSIS — E785 Hyperlipidemia, unspecified: Secondary | ICD-10-CM

## 2023-11-07 LAB — POCT GLYCOSYLATED HEMOGLOBIN (HGB A1C): Hemoglobin A1C: 7.5 % — AB (ref 4.0–5.6)

## 2023-11-07 NOTE — Progress Notes (Signed)
 Ph: [phone] Fax: [phone]   Patient ID: Heather Shaffer, female    DOB: August 20, 1951, 74 y.o.   MRN: [id number]  This visit was conducted in person.  BP 132/88 (BP Location: Left Arm, Patient Position: Sitting, Cuff Size: Normal)   Pulse (!) 55   Temp 97.9 F (36.6 C) (Oral)   Ht 5' 2.5 (1.588 m)   Wt 172 lb (78 kg)   SpO2 100%   BMI 30.96 kg/m    CC: 54mo DM f/u visit  Subjective:   HPI: Heather Shaffer is a 73 y.o. female presenting on 11/07/2023 for 3 Month Diabetes Follow-up and Memory Loss (States memory is getting really bad. )   Stayed local this holiday season. Did not go home this year [place]). Stressful time with family issues.   HLD - last visit we started atorvastatin  20mg  daily - she's not been taking this regularly, none this past week.  Lab Results  Component Value Date   CHOL 200 07/10/2023   HDL 71.50 07/10/2023   LDLCALC 119 (H) 07/10/2023   LDLDIRECT 126.9 06/17/2011   TRIG 46.0 07/10/2023   CHOLHDL 3 07/10/2023    Notes worsening memory difficulty - almost forgot appt this morning even though she wrote down notes to remind herself.  Notes some dizziness.  Mild tremor.  No stiff movements.   DM - does regularly check sugars daily fasting - 130-160s. Denies diet changes. Compliant with antihyperglycemic regimen which includes: metformin  XR 500mg  BID. She was previously hesitant for SGLT2i due to possible side effects. Denies low sugars or hypoglycemic symptoms. Denies blurry vision. Known foot paresthesias. Last diabetic eye exam 11/04/2023 - will request records. Glucometer brand: one touch verio. Last foot exam: 07/2023. DSME: remotely x2.  Lab Results  Component Value Date   HGBA1C 7.5 (A) 11/07/2023   Diabetic Foot Exam - Simple   No data filed    Lab Results  Component Value Date   MICROALBUR <0.7 07/10/2023    Lab Results  Component Value Date   VITAMINB12 941 (H) 07/10/2023       Relevant past medical, surgical,  family and social history reviewed and updated as indicated. Interim medical history since our last visit reviewed. Allergies and medications reviewed and updated. Outpatient Medications Prior to Visit  Medication Sig Dispense Refill   atorvastatin  (LIPITOR) 20 MG tablet Take 1 tablet (20 mg total) by mouth daily. 90 tablet 4   Cholecalciferol (VITAMIN D3) 25 MCG (1000 UT) CAPS Take 1 capsule (1,000 Units total) by mouth daily.     CINNAMON PO Take by mouth.     cyanocobalamin  (VITAMIN B12) 1000 MCG tablet Take 1 tablet (1,000 mcg total) by mouth daily.     metFORMIN  (GLUCOPHAGE -XR) 500 MG 24 hr tablet Take 1 tablet (500 mg total) by mouth 2 (two) times daily with a meal. 180 tablet 4   Multiple Vitamins-Minerals (MULTIVITAMIN ADULTS 50+) TABS Take 1 tablet by mouth daily.     OneTouch Delica Lancets 33G MISC 1 Units by Does not apply route daily. Use 1 a day to check blood sugar Dx Code E11.49 100 each 4   ONETOUCH VERIO test strip USE TO CHECK BLOOD SUGAR ONCE A DAY. DX CODE E11.49 50 strip 9   No facility-administered medications prior to visit.     Per HPI unless specifically indicated in ROS section below Review of Systems  Objective:  BP 132/88 (BP Location: Left Arm, Patient Position: Sitting, Cuff Size: Normal)   Pulse ROLLEN)  55   Temp 97.9 F (36.6 C) (Oral)   Ht 5' 2.5 (1.588 m)   Wt 172 lb (78 kg)   SpO2 100%   BMI 30.96 kg/m   Wt Readings from Last 3 Encounters:  11/07/23 172 lb (78 kg)  08/05/23 175 lb 6 oz (79.5 kg)  07/08/23 179 lb (81.2 kg)      Physical Exam Vitals and nursing note reviewed.  Constitutional:      Appearance: Normal appearance. She is not ill-appearing.  HENT:     Head: Normocephalic and atraumatic.     Mouth/Throat:     Mouth: Mucous membranes are moist.     Pharynx: Oropharynx is clear. No oropharyngeal exudate or posterior oropharyngeal erythema.  Eyes:     Extraocular Movements: Extraocular movements intact.     Conjunctiva/sclera:  Conjunctivae normal.     Pupils: Pupils are equal, round, and reactive to light.  Cardiovascular:     Rate and Rhythm: Normal rate and regular rhythm.     Pulses: Normal pulses.     Heart sounds: Normal heart sounds. No murmur heard. Pulmonary:     Effort: Pulmonary effort is normal. No respiratory distress.     Breath sounds: Normal breath sounds. No wheezing, rhonchi or rales.  Musculoskeletal:     Cervical back: Normal range of motion and neck supple.     Right lower leg: No edema.     Left lower leg: No edema.  Skin:    General: Skin is warm and dry.     Findings: No rash.  Neurological:     Mental Status: She is alert.  Psychiatric:        Mood and Affect: Mood normal.        Behavior: Behavior normal.       Results for orders placed or performed in visit on 11/07/23  HgB A1c   Collection Time: 11/07/23 10:50 AM  Result Value Ref Range   Hemoglobin A1C 7.5 (A) 4.0 - 5.6 %   HbA1c POC (<> result, manual entry)     HbA1c, POC (prediabetic range)     HbA1c, POC (controlled diabetic range)      Assessment & Plan:   Problem List Items Addressed This Visit     Type 2 diabetes mellitus with neurologic complication, without long-term current use of insulin  (HCC) - Primary   Chronic, improved control noted with more regular metformin  use but she's only been taking once daily. Ongoing impaired fasting sugars noted - will increase metformin  XR 500mg  to BID dosing. Reassess control at 3 mo f/u visit.  Pt agrees with plan.       Relevant Orders   HgB A1c (Completed)   Hyperlipidemia associated with type 2 diabetes mellitus (HCC)   She has not been regular with atorvastatin  - encouraged daily use. Update FLP next visit if more regular with statin.       Memory difficulty   She notes worsening trouble with this.  B12 levels normal, she continues regular replacement.  Reviewed 4 core strategies to support a healthy mind as per instructions - encouraged she work on social  engagement component as she notes she's been more isolated this past year (her decision).  RTC 3 mo for formal memory assessment with MMSE.         No orders of the defined types were placed in this encounter.   Orders Placed This Encounter  Procedures   HgB A1c    Patient Instructions  Sugars  are doing better!  Restart atorvastatin  20mg  daily., let us  know if any trouble tolerating this medicine.  Increase metformin  XR 500mg  to twice daily with meals.  Return again in 3 months for diabetes follow up visit and formal memory assessment.   Reviewed 4 core lifestyle modifications to support a healthy mind:  1. Nutritious well balance diet.  2. Regular physical activity routine.  3. Regular mental activity such as reading books, word puzzles, math puzzles, jigsaw puzzles.  4. Social engagement.  Also ensure good blood pressure control, limit alcohol, no smoking.    Follow up plan: Return in about 3 months (around 02/05/2024) for follow up visit.  Anton Blas, MD

## 2023-11-07 NOTE — Assessment & Plan Note (Signed)
 She has not been regular with atorvastatin - encouraged daily use. Update FLP next visit if more regular with statin.

## 2023-11-07 NOTE — Patient Instructions (Addendum)
 Sugars are doing better!  Restart atorvastatin  20mg  daily., let us  know if any trouble tolerating this medicine.  Increase metformin  XR 500mg  to twice daily with meals.  Return again in 3 months for diabetes follow up visit and formal memory assessment.   Reviewed 4 core lifestyle modifications to support a healthy mind:  1. Nutritious well balance diet.  2. Regular physical activity routine.  3. Regular mental activity such as reading books, word puzzles, math puzzles, jigsaw puzzles.  4. Social engagement.  Also ensure good blood pressure control, limit alcohol, no smoking.

## 2023-11-07 NOTE — Assessment & Plan Note (Signed)
 She notes worsening trouble with this.  B12 levels normal, she continues regular replacement.  Reviewed 4 core strategies to support a healthy mind as per instructions - encouraged she work on social engagement component as she notes she's been more isolated this past year (her decision).  RTC 3 mo for formal memory assessment with MMSE.

## 2023-11-07 NOTE — Assessment & Plan Note (Addendum)
 Chronic, improved control noted with more regular metformin use but she's only been taking once daily. Ongoing impaired fasting sugars noted - will increase metformin XR 500mg  to BID dosing. Reassess control at 3 mo f/u visit.  Pt agrees with plan.

## 2024-01-27 ENCOUNTER — Encounter: Payer: Self-pay | Admitting: Family Medicine

## 2024-01-29 ENCOUNTER — Encounter: Payer: Self-pay | Admitting: Family Medicine

## 2024-01-29 NOTE — Telephone Encounter (Signed)
 Did not see pt's name on my list.

## 2024-02-06 ENCOUNTER — Ambulatory Visit: Payer: Medicare Other | Admitting: Family Medicine

## 2024-02-06 ENCOUNTER — Encounter: Payer: Self-pay | Admitting: Family Medicine

## 2024-02-06 VITALS — BP 116/72 | HR 52 | Temp 97.9°F | Ht 62.5 in | Wt 164.1 lb

## 2024-02-06 DIAGNOSIS — Z7984 Long term (current) use of oral hypoglycemic drugs: Secondary | ICD-10-CM

## 2024-02-06 DIAGNOSIS — E1122 Type 2 diabetes mellitus with diabetic chronic kidney disease: Secondary | ICD-10-CM | POA: Diagnosis not present

## 2024-02-06 DIAGNOSIS — R413 Other amnesia: Secondary | ICD-10-CM

## 2024-02-06 DIAGNOSIS — N183 Chronic kidney disease, stage 3 unspecified: Secondary | ICD-10-CM

## 2024-02-06 DIAGNOSIS — R0789 Other chest pain: Secondary | ICD-10-CM | POA: Diagnosis not present

## 2024-02-06 DIAGNOSIS — E1140 Type 2 diabetes mellitus with diabetic neuropathy, unspecified: Secondary | ICD-10-CM

## 2024-02-06 LAB — POCT GLYCOSYLATED HEMOGLOBIN (HGB A1C): Hemoglobin A1C: 7.9 % — AB (ref 4.0–5.6)

## 2024-02-06 MED ORDER — METFORMIN HCL ER 500 MG PO TB24: 500.0000 mg | ORAL_TABLET | Freq: Every day | ORAL | 4 refills | Status: DC

## 2024-02-06 MED ORDER — EMPAGLIFLOZIN 10 MG PO TABS: 10.0000 mg | ORAL_TABLET | Freq: Every day | ORAL | 6 refills | Status: DC

## 2024-02-06 NOTE — Progress Notes (Signed)
 Ph: [phone] Fax: [phone]   Patient ID: Heather Shaffer, female    DOB: [birth year], 73 y.o.   MRN: [id number]  This visit was conducted in person.  BP 116/72   Pulse (!) 52   Temp 97.9 F (36.6 C) (Oral)   Ht 5' 2.5" (1.588 m)   Wt 164 lb 2 oz (74.4 kg)   SpO2 98%   BMI 29.54 kg/m    CC: 3 mo DM f/u visit  Subjective:   HPI: Heather Shaffer is a 73 y.o. female presenting on 02/06/2024 for Medical Management of Chronic Issues (Here for 3 mo DM f/u and memory assessment. )   DM - does regularly check sugars fluctuating up to 200s - 172 fasting today, yesterday 176, 150s earlier in the week. Compliant with antihyperglycemic regimen which includes: metformin XR 500mg  bid. Denies low sugars or hypoglycemic symptoms. Denies paresthesias, blurry vision. Last diabetic eye exam 10/2023. Glucometer brand: one-touch verio. Last foot exam: 07/2023. DSME: completed remotely.  Lab Results  Component Value Date   HGBA1C 7.9 (A) 02/06/2024   Diabetic Foot Exam - Simple   No data filed    Lab Results  Component Value Date   MICROALBUR <0.7 07/10/2023     Notes occasional chest discomfort described as pressure, not exertional but does improve with rest. No pain when doing yardwork.   Worsening memory difficulty noted last visit - B12 normal on replacement.  Here for formal memory evaluation.   Geriatric Assessment: Activities of Daily Living:     Bathing- independent    Dressing- independent    Eating-independent    Toileting-independent    Transferring-independent    Continence-independent Overall Assessment:independent  Instrumental Activities of Daily Living:     Transportation-independent    Meal/Food Preparation-independent    Shopping Errands-independent    Housekeeping/Chores-independent    Money Management/Finances-independent    Medication Management-independent    Ability to Use Telephone-independent    Laundry-independent Overall Assessment:  independent  Mental Status Exam: 27-28/30 (value/max value) Clock Drawing Score: 4/4     Relevant past medical, surgical, family and social history reviewed and updated as indicated. Interim medical history since our last visit reviewed. Allergies and medications reviewed and updated. Outpatient Medications Prior to Visit  Medication Sig Dispense Refill   atorvastatin (LIPITOR) 20 MG tablet Take 1 tablet (20 mg total) by mouth daily. 90 tablet 4   Cholecalciferol (VITAMIN D3) 25 MCG (1000 UT) CAPS Take 1 capsule (1,000 Units total) by mouth daily.     CINNAMON PO Take by mouth.     cyanocobalamin (VITAMIN B12) 1000 MCG tablet Take 1 tablet (1,000 mcg total) by mouth daily.     Multiple Vitamins-Minerals (MULTIVITAMIN ADULTS 50+) TABS Take 1 tablet by mouth daily.     OneTouch Delica Lancets 33G MISC 1 Units by Does not apply route daily. Use 1 a day to check blood sugar Dx Code E11.49 100 each 4   ONETOUCH VERIO test strip USE TO CHECK BLOOD SUGAR ONCE A DAY. DX CODE E11.49 50 strip 9   metFORMIN (GLUCOPHAGE-XR) 500 MG 24 hr tablet Take 1 tablet (500 mg total) by mouth 2 (two) times daily with a meal. 180 tablet 4   No facility-administered medications prior to visit.     Per HPI unless specifically indicated in ROS section below Review of Systems  Objective:  BP 116/72   Pulse (!) 52   Temp 97.9 F (36.6 C) (Oral)   Ht 5' 2.5" (1.588 m)  Wt 164 lb 2 oz (74.4 kg)   SpO2 98%   BMI 29.54 kg/m   Wt Readings from Last 3 Encounters:  02/06/24 164 lb 2 oz (74.4 kg)  11/07/23 172 lb (78 kg)  08/05/23 175 lb 6 oz (79.5 kg)      Physical Exam Vitals and nursing note reviewed.  Constitutional:      Appearance: Normal appearance. She is not ill-appearing.  HENT:     Head: Normocephalic and atraumatic.     Mouth/Throat:     Comments: Wearing mask Neurological:     Mental Status: She is alert.  Psychiatric:        Mood and Affect: Mood normal.        Behavior: Behavior  normal.       Results for orders placed or performed in visit on 02/06/24  POCT glycosylated hemoglobin (Hb A1C)   Collection Time: 02/06/24 10:28 AM  Result Value Ref Range   Hemoglobin A1C 7.9 (A) 4.0 - 5.6 %   HbA1c POC (<> result, manual entry)     HbA1c, POC (prediabetic range)     HbA1c, POC (controlled diabetic range)      Assessment & Plan:   Problem List Items Addressed This Visit     Type 2 diabetes mellitus with neurologic complication, without long-term current use of insulin (HCC) - Primary   Chronic, deteriorated.  Did not tolerate higher metformin XR due to diarrhea/GI upset - back to once daily dosing. Discussed further medication options - she wants to avoid injectable medication ie GLP1RA.  She agrees to try SGLT2i. Start Jardiance 10mg  daily. Reviewed mechanism of action as well as side effects, need to monitor for UTI, yeast infection, groin cellulitis.  RTC 3 mo DM f/u visit.       Relevant Medications   metFORMIN (GLUCOPHAGE-XR) 500 MG 24 hr tablet   empagliflozin (JARDIANCE) 10 MG TABS tablet   Other Relevant Orders   POCT glycosylated hemoglobin (Hb A1C) (Completed)   CKD stage 3 due to type 2 diabetes mellitus (HCC)   Not microalbuminuric.  Start Jardiance as per above.       Relevant Medications   metFORMIN (GLUCOPHAGE-XR) 500 MG 24 hr tablet   empagliflozin (JARDIANCE) 10 MG TABS tablet   Memory difficulty   Overall reassuring MMSE/CDT, testing today.  Continue healthy diet /lifestyle, social engagement, activities that work mind as previously discussed.       Chest discomfort   Notes intermittent chest discomfort that is not exertional and does improve with rest.  Similar to previous symptoms s/p cardiology evaluation 04/2022 - at that time was recommended further evaluation but declined.  Discussed options - return to cardiology for re evaluation vs proceeding with cardiac CT with coronary calcium score to further stratify risk. She continues  to decline further evaluation - will let me know if changes her mind.  Will check EKG today        Meds ordered this encounter  Medications   metFORMIN (GLUCOPHAGE-XR) 500 MG 24 hr tablet    Sig: Take 1 tablet (500 mg total) by mouth daily with breakfast.    Dispense:  90 tablet    Refill:  4    Note change in dose   empagliflozin (JARDIANCE) 10 MG TABS tablet    Sig: Take 1 tablet (10 mg total) by mouth daily before breakfast.    Dispense:  30 tablet    Refill:  6    Orders Placed This Encounter  Procedures   POCT glycosylated hemoglobin (Hb A1C)    Patient Instructions  EKG today  Let me know if interested in further evaluation for chest pain - either with cardiac CT with calcium score or cardiology evaluation.  Drop metformin XR to 500mg  once daily.  Price out jardiance 10mg  daily. Watch for symptoms of UTI, yeast infection, groin skin infection. This has kidney and heart protective effect. Handout provided Return in 3 months for diabetes follow up visit    Follow up plan: Return in about 3 months (around 05/07/2024) for follow up visit.  Eustaquio Boyden, MD

## 2024-02-06 NOTE — Patient Instructions (Addendum)
 EKG today  Let me know if interested in further evaluation for chest pain - either with cardiac CT with calcium score or cardiology evaluation.  Drop metformin XR to 500mg  once daily.  Price out jardiance 10mg  daily. Watch for symptoms of UTI, yeast infection, groin skin infection. This has kidney and heart protective effect. Handout provided Return in 3 months for diabetes follow up visit

## 2024-02-07 ENCOUNTER — Encounter: Payer: Self-pay | Admitting: Family Medicine

## 2024-02-07 NOTE — Assessment & Plan Note (Addendum)
 Overall reassuring MMSE/CDT, testing today.  Continue healthy diet /lifestyle, social engagement, activities that work mind as previously discussed.

## 2024-02-07 NOTE — Assessment & Plan Note (Addendum)
 Notes intermittent chest discomfort that is not exertional and does improve with rest.  Similar to previous symptoms s/p cardiology evaluation 04/2022 - at that time was recommended further evaluation but declined.  Discussed options - return to cardiology for re evaluation vs proceeding with cardiac CT with coronary calcium score to further stratify risk. She continues to decline further evaluation - will let me know if changes her mind.  Will check EKG today

## 2024-02-07 NOTE — Assessment & Plan Note (Signed)
 Chronic, deteriorated.  Did not tolerate higher metformin XR due to diarrhea/GI upset - back to once daily dosing. Discussed further medication options - she wants to avoid injectable medication ie GLP1RA.  She agrees to try SGLT2i. Start Jardiance 10mg  daily. Reviewed mechanism of action as well as side effects, need to monitor for UTI, yeast infection, groin cellulitis.  RTC 3 mo DM f/u visit.

## 2024-02-07 NOTE — Assessment & Plan Note (Addendum)
 Not microalbuminuric.  Start Jardiance as per above.

## 2024-02-09 ENCOUNTER — Telehealth: Payer: Self-pay

## 2024-02-09 NOTE — Telephone Encounter (Addendum)
-----   Message from Eustaquio Boyden sent at 02/07/2024 10:46 AM EDT ----- Morning - did we do EKG for patient? I had wanted to but don't remember if we discussed. If not, can we have her come back for nurse visit for EKG?  Thanks, Wynona Canes  I did not do an EKG on pt.  Spoke with pt informing her Dr Reece Agar wanted an EKG at her 4/4 OV. Pt states she wasn't aware but agrees to have it done. Scheduled NV on 02/10/24 at 2:30. Fyi to Dr Reece Agar.

## 2024-02-09 NOTE — Telephone Encounter (Signed)
-----   Message from Eustaquio Boyden sent at 02/07/2024 10:46 AM EDT ----- Morning - did we do EKG for patient? I had wanted to but don't remember if we discussed. If not, can we have her come back for nurse visit for EKG?  Thanks, Wynona Canes

## 2024-02-10 ENCOUNTER — Ambulatory Visit (INDEPENDENT_AMBULATORY_CARE_PROVIDER_SITE_OTHER)

## 2024-02-10 DIAGNOSIS — R0789 Other chest pain: Secondary | ICD-10-CM | POA: Diagnosis not present

## 2024-02-10 NOTE — Progress Notes (Signed)
 EKG was performed per order of Dr Sharen Hones. It was printed and given to Dr Sharen Hones to interpret. He advised pt could leave and someone would be in contact with her later.

## 2024-02-11 ENCOUNTER — Encounter: Payer: Self-pay | Admitting: Family Medicine

## 2024-05-10 ENCOUNTER — Ambulatory Visit: Admitting: Family Medicine

## 2024-05-10 ENCOUNTER — Encounter: Payer: Self-pay | Admitting: Family Medicine

## 2024-05-10 VITALS — BP 128/76 | HR 61 | Temp 97.9°F | Ht 62.5 in | Wt 168.1 lb

## 2024-05-10 DIAGNOSIS — E1169 Type 2 diabetes mellitus with other specified complication: Secondary | ICD-10-CM

## 2024-05-10 DIAGNOSIS — E1140 Type 2 diabetes mellitus with diabetic neuropathy, unspecified: Secondary | ICD-10-CM

## 2024-05-10 DIAGNOSIS — Z7984 Long term (current) use of oral hypoglycemic drugs: Secondary | ICD-10-CM | POA: Diagnosis not present

## 2024-05-10 DIAGNOSIS — E785 Hyperlipidemia, unspecified: Secondary | ICD-10-CM | POA: Diagnosis not present

## 2024-05-10 LAB — URINALYSIS, ROUTINE W REFLEX MICROSCOPIC
Bilirubin Urine: NEGATIVE
Hgb urine dipstick: NEGATIVE
Ketones, ur: NEGATIVE
Leukocytes,Ua: NEGATIVE
Nitrite: NEGATIVE
RBC / HPF: NONE SEEN (ref 0–?)
Specific Gravity, Urine: <=1.005 — AB (ref 1.000–1.030)
Total Protein, Urine: NEGATIVE
Urine Glucose: NEGATIVE
Urobilinogen, UA: 0.2 (ref 0.0–1.0)
WBC, UA: NONE SEEN (ref 0–?)
pH: 6 (ref 5.0–8.0)

## 2024-05-10 LAB — POCT GLYCOSYLATED HEMOGLOBIN (HGB A1C): Hemoglobin A1C: 9.7 % — AB (ref 4.0–5.6)

## 2024-05-10 LAB — RENAL FUNCTION PANEL
Albumin: 4.1 g/dL (ref 3.5–5.2)
BUN: 17 mg/dL (ref 6–23)
CO2: 33 meq/L — ABNORMAL HIGH (ref 19–32)
Calcium: 9.7 mg/dL (ref 8.4–10.5)
Chloride: 99 meq/L (ref 96–112)
Creatinine, Ser: 1.04 mg/dL (ref 0.40–1.20)
GFR: 53.27 mL/min — ABNORMAL LOW (ref >60.00)
Glucose, Bld: 236 mg/dL — ABNORMAL HIGH (ref 70–99)
Phosphorus: 3.6 mg/dL (ref 2.3–4.6)
Potassium: 4.2 meq/L (ref 3.5–5.1)
Sodium: 138 meq/L (ref 135–145)

## 2024-05-10 MED ORDER — PIOGLITAZONE HCL 15 MG PO TABS: 15.0000 mg | ORAL_TABLET | Freq: Every day | ORAL | 5 refills | Status: DC

## 2024-05-10 NOTE — Assessment & Plan Note (Signed)
 Chronic, deteriorated. Farxiga  was unaffordable and she doesn't think she would qualify for PAP.  Metformin  XR dose titration limited by side effects. Recommend starting insulin  - she is adamantly against injections.  Offered endo referral - declined.  Avoiding sulfonylurea in h/o sulfa allergy (tongue swelling).  Will start actos  15mg  daily - no known h/o CHF or OP.  She agrees to referral for 2 hour diabetes refresher course with nutritionist.  Reassess control at CPE in 3 months.

## 2024-05-10 NOTE — Patient Instructions (Addendum)
 Try atorvastatin  20mg  daily for cholesterol Continue metformin  XR 500mg  daily. Add actos  (pioglitazone ) 15mg  daily.  Labs and urine test today  I will also refer you to diabetes education classes.  Return in 3 months for physical/wellness visit

## 2024-05-10 NOTE — Assessment & Plan Note (Addendum)
 Has not been taking atorvastatin .  Reviewed reasons to take statin.  She will try this. Reviewed monitoring for worsening myalgias The 10-year ASCVD risk score (Arnett DK, et al., 2019) is: 33.2%   Values used to calculate the score:     Age: 73 years     Clincally relevant sex: Female     Is Non-Hispanic African American: Yes     Diabetic: Yes     Tobacco smoker: No     Systolic Blood Pressure: 128 mmHg     Is BP treated: No     HDL Cholesterol: 71.5 mg/dL     Total Cholesterol: 200 mg/dL

## 2024-05-10 NOTE — Progress Notes (Signed)
 Ph: [phone] Fax: [phone]   Patient ID: Heather Shaffer, female    DOB: 16-Aug-1951, 73 y.o.   MRN: [id number]  This visit was conducted in person.  BP 128/76   Pulse 61   Temp 97.9 F (36.6 C) (Oral)   Ht 5' 2.5 (1.588 m)   Wt 168 lb 2 oz (76.3 kg)   SpO2 99%   BMI 30.26 kg/m    CC: 3 mo DM f/u visit  Subjective:   HPI: Heather Shaffer is a 73 y.o. female presenting on 05/10/2024 for Medical Management of Chronic Issues (Here for 3 mo DM f/u.)   DM - does regularly check sugars - fasting today 276, peak 459. Compliant with antihyperglycemic regimen which includes: metformin  XR 500mg  daily (trouble tolerating higher dose). Jardiance  was $420/month so she did not try this. Denies low sugars or hypoglycemic symptoms. Denies paresthesias, blurry vision. Last diabetic eye exam 10/2023. Glucometer brand: one touch verio. Last foot exam: 07/2023. DSME: completed remotely. She eats a lot of apples. She's added some chicken. She finds sugars do come down when she eats meal with protein.  She does not injections. Notes trouble with R hand due to carpal tunnel  she is right handed. She doesn't have anyone that could give her shots.  Lab Results  Component Value Date   HGBA1C 9.7 (A) 05/10/2024   Diabetic Foot Exam - Simple   No data filed    Lab Results  Component Value Date   MICROALBUR 0.50 10/09/2012   She notes sugars have remained out of control after taking doxycycline  course for tick bite .   Denies further chest discomfort.      Relevant past medical, surgical, family and social history reviewed and updated as indicated. Interim medical history since our last visit reviewed. Allergies and medications reviewed and updated. Outpatient Medications Prior to Visit  Medication Sig Dispense Refill   Cholecalciferol (VITAMIN D3) 25 MCG (1000 UT) CAPS Take 1 capsule (1,000 Units total) by mouth daily.     CINNAMON PO Take by mouth.     cyanocobalamin  (VITAMIN B12)  1000 MCG tablet Take 1 tablet (1,000 mcg total) by mouth daily.     metFORMIN  (GLUCOPHAGE -XR) 500 MG 24 hr tablet Take 1 tablet (500 mg total) by mouth daily with breakfast. 90 tablet 4   Multiple Vitamins-Minerals (MULTIVITAMIN ADULTS 50+) TABS Take 1 tablet by mouth daily.     OneTouch Delica Lancets 33G MISC 1 Units by Does not apply route daily. Use 1 a day to check blood sugar Dx Code E11.49 100 each 4   ONETOUCH VERIO test strip USE TO CHECK BLOOD SUGAR ONCE A DAY. DX CODE E11.49 50 strip 9   atorvastatin  (LIPITOR) 20 MG tablet Take 1 tablet (20 mg total) by mouth daily. (Patient not taking: Reported on 05/10/2024) 90 tablet 4   empagliflozin  (JARDIANCE ) 10 MG TABS tablet Take 1 tablet (10 mg total) by mouth daily before breakfast. (Patient not taking: Reported on 05/10/2024) 30 tablet 6   No facility-administered medications prior to visit.     Per HPI unless specifically indicated in ROS section below Review of Systems  Objective:  BP 128/76   Pulse 61   Temp 97.9 F (36.6 C) (Oral)   Ht 5' 2.5 (1.588 m)   Wt 168 lb 2 oz (76.3 kg)   SpO2 99%   BMI 30.26 kg/m   Wt Readings from Last 3 Encounters:  05/10/24 168 lb 2 oz (76.3 kg)  02/06/24 164 lb 2 oz (74.4 kg)  11/07/23 172 lb (78 kg)      Physical Exam Vitals and nursing note reviewed.  Constitutional:      Appearance: Normal appearance. She is not ill-appearing.  Eyes:     Extraocular Movements: Extraocular movements intact.     Conjunctiva/sclera: Conjunctivae normal.     Pupils: Pupils are equal, round, and reactive to light.  Cardiovascular:     Rate and Rhythm: Normal rate and regular rhythm.     Pulses: Normal pulses.     Heart sounds: Normal heart sounds. No murmur heard. Pulmonary:     Effort: Pulmonary effort is normal. No respiratory distress.     Breath sounds: Normal breath sounds. No wheezing, rhonchi or rales.  Musculoskeletal:     Right lower leg: No edema.     Left lower leg: No edema.  Skin:     General: Skin is warm and dry.     Findings: No rash.  Neurological:     Mental Status: She is alert.  Psychiatric:        Mood and Affect: Mood normal.        Behavior: Behavior normal.       Results for orders placed or performed in visit on 05/10/24  POCT glycosylated hemoglobin (Hb A1C)   Collection Time: 05/10/24 12:43 PM  Result Value Ref Range   Hemoglobin A1C 9.7 (A) 4.0 - 5.6 %   HbA1c POC (<> result, manual entry)     HbA1c, POC (prediabetic range)     HbA1c, POC (controlled diabetic range)     Lab Results  Component Value Date   CHOL 200 07/10/2023   HDL 71.50 07/10/2023   LDLCALC 119 (H) 07/10/2023   LDLDIRECT 126.9 06/17/2011   TRIG 46.0 07/10/2023   CHOLHDL 3 07/10/2023   Lab Results  Component Value Date   NA 140 07/10/2023   CL 103 07/10/2023   K 4.0 07/10/2023   CO2 31 07/10/2023   BUN 18 07/10/2023   CREATININE 1.16 07/10/2023   GFR 47.00 (L) 07/10/2023   CALCIUM  9.9 07/10/2023   PHOS 4.1 07/31/2022   ALBUMIN 3.9 07/10/2023   GLUCOSE 146 (H) 07/10/2023   Lab Results  Component Value Date   WBC 4.1 07/10/2023   HGB 12.7 07/10/2023   HCT 40.1 07/10/2023   MCV 86.0 07/10/2023   PLT 208.0 07/10/2023   Assessment & Plan:   Problem List Items Addressed This Visit     Type 2 diabetes mellitus with neurologic complication, without long-term current use of insulin  (HCC) - Primary   Chronic, deteriorated. Farxiga  was unaffordable and she doesn't think she would qualify for PAP.  Metformin  XR dose titration limited by side effects. Recommend starting insulin  - she is adamantly against injections.  Offered endo referral - declined.  Avoiding sulfonylurea in h/o sulfa allergy (tongue swelling).  Will start actos  15mg  daily - no known h/o CHF or OP.  She agrees to referral for 2 hour diabetes refresher course with nutritionist.  Reassess control at CPE in 3 months.       Relevant Medications   pioglitazone  (ACTOS ) 15 MG tablet   Other Relevant  Orders   POCT glycosylated hemoglobin (Hb A1C) (Completed)   Renal function panel   Insulin  and C-Peptide   Urinalysis, Routine w reflex microscopic   Ambulatory referral to diabetic education   Hyperlipidemia associated with type 2 diabetes mellitus (HCC)   Has not been taking atorvastatin .  Reviewed  reasons to take statin.  She will try this. Reviewed monitoring for worsening myalgias The 10-year ASCVD risk score (Arnett DK, et al., 2019) is: 33.2%   Values used to calculate the score:     Age: 73 years     Clincally relevant sex: Female     Is Non-Hispanic African American: Yes     Diabetic: Yes     Tobacco smoker: No     Systolic Blood Pressure: 128 mmHg     Is BP treated: No     HDL Cholesterol: 71.5 mg/dL     Total Cholesterol: 200 mg/dL       Relevant Medications   pioglitazone  (ACTOS ) 15 MG tablet     Meds ordered this encounter  Medications   pioglitazone  (ACTOS ) 15 MG tablet    Sig: Take 1 tablet (15 mg total) by mouth daily.    Dispense:  30 tablet    Refill:  5    Orders Placed This Encounter  Procedures   Renal function panel   Insulin  and C-Peptide   Urinalysis, Routine w reflex microscopic   Ambulatory referral to diabetic education    Referral Priority:   Routine    Referral Type:   Consultation    Referral Reason:   Specialty Services Required    Number of Visits Requested:   1   POCT glycosylated hemoglobin (Hb A1C)    Patient Instructions  Try atorvastatin  20mg  daily for cholesterol Continue metformin  XR 500mg  daily. Add actos  (pioglitazone ) 15mg  daily.  Labs and urine test today  I will also refer you to diabetes education classes.  Return in 3 months for physical/wellness visit  Follow up plan: Return in about 3 months (around 08/10/2024) for annual exam, prior fasting for blood work, medicare wellness visit.  Anton Blas, MD

## 2024-05-11 ENCOUNTER — Ambulatory Visit: Payer: Self-pay | Admitting: Family Medicine

## 2024-05-12 LAB — INSULIN AND C-PEPTIDE, SERUM
C-Peptide: 2.1 ng/mL (ref 1.1–4.4)
INSULIN: 7.6 u[IU]/mL (ref 2.6–24.9)

## 2024-06-29 ENCOUNTER — Telehealth: Payer: Self-pay

## 2024-06-29 NOTE — Telephone Encounter (Signed)
 Patient was identified as falling into the [place] Measure - Diabetes.   Patient was: Appointment already scheduled for:  08/11/24.  Patient had med changes at last visit in July. Declined insulin  at that time.

## 2024-08-01 ENCOUNTER — Other Ambulatory Visit: Payer: Self-pay | Admitting: Family Medicine

## 2024-08-01 DIAGNOSIS — E1169 Type 2 diabetes mellitus with other specified complication: Secondary | ICD-10-CM

## 2024-08-01 DIAGNOSIS — E1140 Type 2 diabetes mellitus with diabetic neuropathy, unspecified: Secondary | ICD-10-CM

## 2024-08-01 DIAGNOSIS — R413 Other amnesia: Secondary | ICD-10-CM

## 2024-08-01 DIAGNOSIS — E1122 Type 2 diabetes mellitus with diabetic chronic kidney disease: Secondary | ICD-10-CM

## 2024-08-04 ENCOUNTER — Other Ambulatory Visit (INDEPENDENT_AMBULATORY_CARE_PROVIDER_SITE_OTHER)

## 2024-08-04 DIAGNOSIS — E785 Hyperlipidemia, unspecified: Secondary | ICD-10-CM | POA: Diagnosis not present

## 2024-08-04 DIAGNOSIS — E1140 Type 2 diabetes mellitus with diabetic neuropathy, unspecified: Secondary | ICD-10-CM

## 2024-08-04 DIAGNOSIS — E1169 Type 2 diabetes mellitus with other specified complication: Secondary | ICD-10-CM

## 2024-08-04 DIAGNOSIS — N183 Chronic kidney disease, stage 3 unspecified: Secondary | ICD-10-CM

## 2024-08-04 DIAGNOSIS — E1122 Type 2 diabetes mellitus with diabetic chronic kidney disease: Secondary | ICD-10-CM | POA: Diagnosis not present

## 2024-08-04 DIAGNOSIS — R413 Other amnesia: Secondary | ICD-10-CM

## 2024-08-04 LAB — TSH: TSH: 1.53 u[IU]/mL (ref 0.35–5.50)

## 2024-08-05 ENCOUNTER — Ambulatory Visit (INDEPENDENT_AMBULATORY_CARE_PROVIDER_SITE_OTHER)

## 2024-08-05 VITALS — BP 128/76 | Ht 62.5 in | Wt 164.0 lb

## 2024-08-05 DIAGNOSIS — Z0000 Encounter for general adult medical examination without abnormal findings: Secondary | ICD-10-CM | POA: Diagnosis not present

## 2024-08-05 LAB — PHOSPHORUS: Phosphorus: 4 mg/dL (ref 2.3–4.6)

## 2024-08-05 LAB — COMPREHENSIVE METABOLIC PANEL WITH GFR
ALT: 16 U/L (ref 0–35)
AST: 18 U/L (ref 0–37)
Albumin: 4 g/dL (ref 3.5–5.2)
Alkaline Phosphatase: 104 U/L (ref 39–117)
BUN: 23 mg/dL (ref 6–23)
CO2: 30 meq/L (ref 19–32)
Calcium: 9.9 mg/dL (ref 8.4–10.5)
Chloride: 99 meq/L (ref 96–112)
Creatinine, Ser: 1.07 mg/dL (ref 0.40–1.20)
GFR: 51.4 mL/min — ABNORMAL LOW (ref >60.00)
Glucose, Bld: 277 mg/dL — ABNORMAL HIGH (ref 70–99)
Potassium: 4.4 meq/L (ref 3.5–5.1)
Sodium: 135 meq/L (ref 135–145)
Total Bilirubin: 0.5 mg/dL (ref 0.2–1.2)
Total Protein: 7 g/dL (ref 6.0–8.3)

## 2024-08-05 LAB — MICROALBUMIN / CREATININE URINE RATIO
Creatinine,U: 66.9 mg/dL
Microalb Creat Ratio: UNDETERMINED mg/g (ref 0.0–30.0)
Microalb, Ur: <0.7 mg/dL

## 2024-08-05 LAB — CBC WITH DIFFERENTIAL/PLATELET
Basophils Absolute: 0.1 K/uL (ref 0.0–0.1)
Basophils Relative: 1.1 % (ref 0.0–3.0)
Eosinophils Absolute: 0.2 K/uL (ref 0.0–0.7)
Eosinophils Relative: 5.5 % — ABNORMAL HIGH (ref 0.0–5.0)
HCT: 40.8 % (ref 36.0–46.0)
Hemoglobin: 13.2 g/dL (ref 12.0–15.0)
Lymphocytes Relative: 36.6 % (ref 12.0–46.0)
Lymphs Abs: 1.6 K/uL (ref 0.7–4.0)
MCHC: 32.5 g/dL (ref 30.0–36.0)
MCV: 84.6 fl (ref 78.0–100.0)
Monocytes Absolute: 0.4 K/uL (ref 0.1–1.0)
Monocytes Relative: 8.6 % (ref 3.0–12.0)
Neutro Abs: 2.1 K/uL (ref 1.4–7.7)
Neutrophils Relative %: 48.2 % (ref 43.0–77.0)
Platelets: 186 K/uL (ref 150.0–400.0)
RBC: 4.82 Mil/uL (ref 3.87–5.11)
RDW: 14.4 % (ref 11.5–15.5)
WBC: 4.4 K/uL (ref 4.0–10.5)

## 2024-08-05 LAB — PARATHYROID HORMONE, INTACT (NO CA): PTH: 16 pg/mL (ref 16–77)

## 2024-08-05 LAB — HEMOGLOBIN A1C: Hgb A1c MFr Bld: 13.2 % — ABNORMAL HIGH (ref 4.6–6.5)

## 2024-08-05 LAB — VITAMIN B12: Vitamin B-12: >1500 pg/mL — ABNORMAL HIGH (ref 211–911)

## 2024-08-05 LAB — LIPID PANEL
Cholesterol: 193 mg/dL (ref 0–200)
HDL: 80.6 mg/dL (ref >39.00)
LDL Cholesterol: 104 mg/dL — ABNORMAL HIGH (ref 0–99)
NonHDL: 112.3
Total CHOL/HDL Ratio: 2
Triglycerides: 43 mg/dL (ref 0.0–149.0)
VLDL: 8.6 mg/dL (ref 0.0–40.0)

## 2024-08-05 LAB — VITAMIN D 25 HYDROXY (VIT D DEFICIENCY, FRACTURES): VITD: 47.45 ng/mL (ref 30.00–100.00)

## 2024-08-05 NOTE — Patient Instructions (Signed)
 Heather Shaffer,  Thank you for taking the time for your Medicare Wellness Visit. I appreciate your continued commitment to your health goals. Please review the care plan we discussed, and feel free to reach out if I can assist you further.  Medicare recommends these wellness visits once per year to help you and your care team stay ahead of potential health issues. These visits are designed to focus on prevention, allowing your provider to concentrate on managing your acute and chronic conditions during your regular appointments.  Please note that Annual Wellness Visits do not include a physical exam. Some assessments may be limited, especially if the visit was conducted virtually. If needed, we may recommend a separate in-person follow-up with your provider.  Ongoing Care Seeing your primary care provider every 3 to 6 months helps us  monitor your health and provide consistent, personalized care.   Referrals If a referral was made during today's visit and you haven't received any updates within two weeks, please contact the referred provider directly to check on the status.  Recommended Screenings:  Health Maintenance  Topic Date Due   Hepatitis C Screening  Never done   DTaP/Tdap/Td vaccine (1 - Tdap) Never done   Pneumococcal Vaccine for age over 30 (1 of 2 - PCV) Never done   Zoster (Shingles) Vaccine (1 of 2) 09/17/2000   Flu Shot  06/04/2024   Complete foot exam   07/07/2024   Eye exam for diabetics  11/03/2024   Hemoglobin A1C  02/02/2025   Yearly kidney function blood test for diabetes  08/04/2025   Yearly kidney health urinalysis for diabetes  08/04/2025   Medicare Annual Wellness Visit  08/05/2025   Breast Cancer Screening  08/07/2025   Colon Cancer Screening  10/03/2028   DEXA scan (bone density measurement)  Completed   HPV Vaccine  Aged Out   Meningitis B Vaccine  Aged Out   COVID-19 Vaccine  Discontinued       08/05/2024    2:11 PM  Advanced Directives  Does Patient  Have a Medical Advance Directive? No  Would patient like information on creating a medical advance directive? No - Patient declined   Advance Care Planning is important because it: Ensures you receive medical care that aligns with your values, goals, and preferences. Provides guidance to your family and loved ones, reducing the emotional burden of decision-making during critical moments.  Vision: Annual vision screenings are recommended for early detection of glaucoma, cataracts, and diabetic retinopathy. These exams can also reveal signs of chronic conditions such as diabetes and high blood pressure.  Dental: Annual dental screenings help detect early signs of oral cancer, gum disease, and other conditions linked to overall health, including heart disease and diabetes.  Please see the attached documents for additional preventive care recommendations.
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
## 2024-08-09 ENCOUNTER — Telehealth: Payer: Self-pay | Admitting: Family Medicine

## 2024-08-09 ENCOUNTER — Ambulatory Visit: Payer: Self-pay | Admitting: Family Medicine

## 2024-08-09 NOTE — Telephone Encounter (Signed)
 Copied from CRM #[id number]. Topic: MyChart - Other >> Aug 09, 2024 12:13 PM Antwanette L wrote: Reason for CRM: The patient has questions regarding her MyChart account. She reports that when she logs in, she does not see any dates listed for her medical history. Patient is requesting a callback at [phone]  Hi there, for further assistance... the mychart help desk will be able to help you with your my chart situation. The phone number is [phone]

## 2024-08-11 ENCOUNTER — Encounter: Payer: Self-pay | Admitting: Family Medicine

## 2024-08-11 ENCOUNTER — Ambulatory Visit: Admitting: Family Medicine

## 2024-08-11 VITALS — BP 130/82 | HR 70 | Temp 97.9°F | Ht 62.5 in | Wt 170.4 lb

## 2024-08-11 DIAGNOSIS — Z7189 Other specified counseling: Secondary | ICD-10-CM

## 2024-08-11 DIAGNOSIS — E1140 Type 2 diabetes mellitus with diabetic neuropathy, unspecified: Secondary | ICD-10-CM

## 2024-08-11 DIAGNOSIS — E785 Hyperlipidemia, unspecified: Secondary | ICD-10-CM

## 2024-08-11 DIAGNOSIS — E1169 Type 2 diabetes mellitus with other specified complication: Secondary | ICD-10-CM

## 2024-08-11 DIAGNOSIS — Z7984 Long term (current) use of oral hypoglycemic drugs: Secondary | ICD-10-CM | POA: Diagnosis not present

## 2024-08-11 DIAGNOSIS — E1122 Type 2 diabetes mellitus with diabetic chronic kidney disease: Secondary | ICD-10-CM | POA: Diagnosis not present

## 2024-08-11 DIAGNOSIS — N183 Chronic kidney disease, stage 3 unspecified: Secondary | ICD-10-CM

## 2024-08-11 DIAGNOSIS — Z0001 Encounter for general adult medical examination with abnormal findings: Secondary | ICD-10-CM | POA: Diagnosis not present

## 2024-08-11 DIAGNOSIS — E1165 Type 2 diabetes mellitus with hyperglycemia: Secondary | ICD-10-CM

## 2024-08-11 DIAGNOSIS — R7989 Other specified abnormal findings of blood chemistry: Secondary | ICD-10-CM

## 2024-08-11 DIAGNOSIS — E66811 Obesity, class 1: Secondary | ICD-10-CM

## 2024-08-11 MED ORDER — PIOGLITAZONE HCL 15 MG PO TABS: 15.0000 mg | ORAL_TABLET | Freq: Every day | ORAL | 3 refills | Status: AC

## 2024-08-11 MED ORDER — ATORVASTATIN CALCIUM 20 MG PO TABS: 20.0000 mg | ORAL_TABLET | Freq: Every day | ORAL | 3 refills | Status: AC

## 2024-08-11 MED ORDER — METFORMIN HCL ER 500 MG PO TB24
500.0000 mg | ORAL_TABLET | Freq: Two times a day (BID) | ORAL | 3 refills | Status: AC
Start: 2024-08-11 — End: ?

## 2024-08-11 NOTE — Patient Instructions (Addendum)
 Consider Flu shot and Pneumonia shot (Prevnar-20).  Stop b12 as levels were too high  Restart atorvastatin  for cholesterol control.  Continue metformin  twice daily. Restart actos  (pioglitazone ) 15mg  daily for sugar control.  I will refer you to diabetes doctor endocrinologist.  I will place new referral for diabetes classes.  Check with your gynecologist about repeat mammogram.  Bring us  copy of your living will.  Return in 3 months for follow up visit

## 2024-08-11 NOTE — Progress Notes (Signed)
 Ph: [phone] Fax: [phone]   Patient ID: Heather Shaffer, female    DOB: 1951/08/04, 73 y.o.   MRN: [id number]  This visit was conducted in person.  BP 130/82   Pulse 70   Temp 97.9 F (36.6 C) (Oral)   Ht 5' 2.5 (1.588 m)   Wt 170 lb 6 oz (77.3 kg)   SpO2 97%   BMI 30.67 kg/m    CC: CPE  Subjective:   HPI: Heather Shaffer is a 73 y.o. female presenting on 08/11/2024 for Annual Exam   Saw health advisor last week for medicare wellness visit. Note reviewed.   No results found.  Flowsheet Row Office Visit from 08/11/2024 in [facility] [place]  PHQ-2 Total Score 2       08/11/2024   10:47 AM 08/04/2024   11:00 PM 05/10/2024    2:03 PM 02/06/2024   10:27 AM 08/05/2023   10:28 AM  Fall Risk   Falls in the past year? 0 0 0 0 0  Number falls in past yr: 0 0     Injury with Fall? 0 0     Risk for fall due to : No Fall Risks No Fall Risks     Follow up Falls evaluation completed Falls evaluation completed        T2DM - only on metformin  XR 500mg  bid. Jardiance  was unaffordable. Glucometer brand: one touch verio. Insulin  /C peptide low for degree of hyperglycemia when checked 05/2024. Has previously declined insulin  or other injections due to concern over self administering injections in R carpal tunnel syndrome, doesn't have anyone that could help her administer injections. Last visit we added actos  15mg  daily - she did not start tihs. Last visit referred to diabetes classes - did not schedule this - will re-refer.  Notes increased stress.  No fevers, diarrhea, cough, skin infections, UTI symptoms.   HLD - had previously stopped atorvastatin . Again today states she's not taking this. Again encouraged she start taking.   Preventative: Colonoscopy - had this done 2022 through [facility] - recall 2029.  Lung cancer screening - not eligible Mammogram - due Well woman exam with [facility]. Due for f/u.  DEXA scan - remotely 2010, WNL.   Flu shot - yearly, declines today  COVID shot - 05/2020, 06/2020, booster 05/2021 Tetanus shot - unsure  Prevnar-20 due - declines.  Shingrix - discussed. Doesn't think she's had chicken pox Advanced directive discussion - has living will. Will bring us  a copy. Daughter Heather Shaffer is HCPOA. Full code.  Seat belt use discussed Sunscreen use discussed. No changing moles on skin. Non smoker Alcohol  - none  Dentist - has not seen - unsure if she has dental insurance  Eye exam - yearly in December Bowel - no constipation Bladder - no incontinence  Separated from husband - she lives alone Son lives in [place]     Relevant past medical, surgical, family and social history reviewed and updated as indicated. Interim medical history since our last visit reviewed. Allergies and medications reviewed and updated. Outpatient Medications Prior to Visit  Medication Sig Dispense Refill   Cholecalciferol (VITAMIN D3) 25 MCG (1000 UT) CAPS Take 1 capsule (1,000 Units total) by mouth daily.     CINNAMON PO Take by mouth.     Multiple Vitamins-Minerals (MULTIVITAMIN ADULTS 50+) TABS Take 1 tablet by mouth daily.     OneTouch Delica Lancets 33G MISC 1 Units by Does not apply  route daily. Use 1 a day to check blood sugar Dx Code E11.49 100 each 4   ONETOUCH VERIO test strip USE TO CHECK BLOOD SUGAR ONCE A DAY. DX CODE E11.49 50 strip 9   atorvastatin  (LIPITOR) 20 MG tablet Take 1 tablet (20 mg total) by mouth daily. (Patient not taking: Reported on 05/10/2024) 90 tablet 4   cyanocobalamin  (VITAMIN B12) 1000 MCG tablet Take 1 tablet (1,000 mcg total) by mouth daily.     metFORMIN  (GLUCOPHAGE -XR) 500 MG 24 hr tablet Take 1 tablet (500 mg total) by mouth daily with breakfast. 90 tablet 4   pioglitazone  (ACTOS ) 15 MG tablet Take 1 tablet (15 mg total) by mouth daily. 30 tablet 5   No facility-administered medications prior to visit.     Per HPI unless specifically indicated in ROS section below Review of  Systems  Constitutional:  Negative for activity change, appetite change, chills, fatigue, fever and unexpected weight change.  HENT:  Negative for hearing loss.   Eyes:  Negative for visual disturbance.  Respiratory:  Negative for cough, chest tightness, shortness of breath and wheezing.   Cardiovascular:  Positive for chest pain (occ) and palpitations. Negative for leg swelling.  Gastrointestinal:  Positive for abdominal pain (occ R sided). Negative for abdominal distention, blood in stool, constipation, diarrhea, nausea and vomiting.  Genitourinary:  Negative for difficulty urinating and hematuria.  Musculoskeletal:  Negative for arthralgias, myalgias and neck pain.  Skin:  Negative for rash.  Neurological:  Negative for dizziness, seizures, syncope and headaches.  Hematological:  Negative for adenopathy. Does not bruise/bleed easily.  Psychiatric/Behavioral:  Negative for dysphoric mood. The patient is nervous/anxious (stress related).     Objective:  BP 130/82   Pulse 70   Temp 97.9 F (36.6 C) (Oral)   Ht 5' 2.5 (1.588 m)   Wt 170 lb 6 oz (77.3 kg)   SpO2 97%   BMI 30.67 kg/m   Wt Readings from Last 3 Encounters:  08/11/24 170 lb 6 oz (77.3 kg)  08/05/24 164 lb (74.4 kg)  05/10/24 168 lb 2 oz (76.3 kg)      Physical Exam Vitals and nursing note reviewed.  Constitutional:      Appearance: Normal appearance. She is not ill-appearing.  HENT:     Head: Normocephalic and atraumatic.     Right Ear: Tympanic membrane, ear canal and external ear normal. There is no impacted cerumen.     Left Ear: Tympanic membrane, ear canal and external ear normal. There is no impacted cerumen.     Mouth/Throat:     Mouth: Mucous membranes are moist.     Pharynx: Oropharynx is clear. No oropharyngeal exudate or posterior oropharyngeal erythema.  Eyes:     General:        Right eye: No discharge.        Left eye: No discharge.     Extraocular Movements: Extraocular movements intact.      Conjunctiva/sclera: Conjunctivae normal.     Pupils: Pupils are equal, round, and reactive to light.  Neck:     Thyroid : No thyroid  mass or thyromegaly.     Vascular: No carotid bruit.  Cardiovascular:     Rate and Rhythm: Normal rate and regular rhythm.     Pulses: Normal pulses.     Heart sounds: Normal heart sounds. No murmur heard. Pulmonary:     Effort: Pulmonary effort is normal. No respiratory distress.     Breath sounds: Normal breath sounds. No wheezing,  rhonchi or rales.  Abdominal:     General: Bowel sounds are normal. There is no distension.     Palpations: Abdomen is soft. There is no mass.     Tenderness: There is no abdominal tenderness. There is no guarding or rebound.     Hernia: No hernia is present.  Musculoskeletal:     Cervical back: Normal range of motion and neck supple. No rigidity.     Right lower leg: No edema.     Left lower leg: No edema.  Lymphadenopathy:     Cervical: No cervical adenopathy.  Skin:    General: Skin is warm and dry.     Findings: No rash.  Neurological:     General: No focal deficit present.     Mental Status: She is alert. Mental status is at baseline.  Psychiatric:        Mood and Affect: Mood normal.        Behavior: Behavior normal.       Results for orders placed or performed in visit on 08/04/24  TSH   Collection Time: 08/04/24 10:40 AM  Result Value Ref Range   TSH 1.53 0.35 - 5.50 uIU/mL  Vitamin B12   Collection Time: 08/04/24 10:40 AM  Result Value Ref Range   Vitamin B-12 >1500 (H) 211 - 911 pg/mL  CBC with Differential/Platelet   Collection Time: 08/04/24 10:40 AM  Result Value Ref Range   WBC 4.4 4.0 - 10.5 K/uL   RBC 4.82 3.87 - 5.11 Mil/uL   Hemoglobin 13.2 12.0 - 15.0 g/dL   HCT 59.1 63.9 - 53.9 %   MCV 84.6 78.0 - 100.0 fl   MCHC 32.5 30.0 - 36.0 g/dL   RDW 85.5 88.4 - 84.4 %   Platelets 186.0 150.0 - 400.0 K/uL   Neutrophils Relative % 48.2 43.0 - 77.0 %   Lymphocytes Relative 36.6 12.0 - 46.0 %    Monocytes Relative 8.6 3.0 - 12.0 %   Eosinophils Relative 5.5 (H) 0.0 - 5.0 %   Basophils Relative 1.1 0.0 - 3.0 %   Neutro Abs 2.1 1.4 - 7.7 K/uL   Lymphs Abs 1.6 0.7 - 4.0 K/uL   Monocytes Absolute 0.4 0.1 - 1.0 K/uL   Eosinophils Absolute 0.2 0.0 - 0.7 K/uL   Basophils Absolute 0.1 0.0 - 0.1 K/uL  Parathyroid hormone, intact (no Ca)   Collection Time: 08/04/24 10:40 AM  Result Value Ref Range   PTH 16 16 - 77 pg/mL  Microalbumin / creatinine urine ratio   Collection Time: 08/04/24 10:40 AM  Result Value Ref Range   Microalb, Ur <0.7 mg/dL   Creatinine,U 33.0 mg/dL   Microalb Creat Ratio Unable to calculate 0.0 - 30.0 mg/g  VITAMIN D  25 Hydroxy (Vit-D Deficiency, Fractures)   Collection Time: 08/04/24 10:40 AM  Result Value Ref Range   VITD 47.45 30.00 - 100.00 ng/mL  Phosphorus   Collection Time: 08/04/24 10:40 AM  Result Value Ref Range   Phosphorus 4.0 2.3 - 4.6 mg/dL  Comprehensive metabolic panel with GFR   Collection Time: 08/04/24 10:40 AM  Result Value Ref Range   Sodium 135 135 - 145 mEq/L   Potassium 4.4 3.5 - 5.1 mEq/L   Chloride 99 96 - 112 mEq/L   CO2 30 19 - 32 mEq/L   Glucose, Bld 277 (H) 70 - 99 mg/dL   BUN 23 6 - 23 mg/dL   Creatinine, Ser 8.92 0.40 - 1.20 mg/dL   Total  Bilirubin 0.5 0.2 - 1.2 mg/dL   Alkaline Phosphatase 104 39 - 117 U/L   AST 18 0 - 37 U/L   ALT 16 0 - 35 U/L   Total Protein 7.0 6.0 - 8.3 g/dL   Albumin 4.0 3.5 - 5.2 g/dL   GFR 48.59 (L) >39.99 mL/min   Calcium  9.9 8.4 - 10.5 mg/dL  Lipid panel   Collection Time: 08/04/24 10:40 AM  Result Value Ref Range   Cholesterol 193 0 - 200 mg/dL   Triglycerides 56.9 0.0 - 149.0 mg/dL   HDL 19.39 >60.99 mg/dL   VLDL 8.6 0.0 - 59.9 mg/dL   LDL Cholesterol 895 (H) 0 - 99 mg/dL   Total CHOL/HDL Ratio 2    NonHDL 112.30   Hemoglobin A1c   Collection Time: 08/04/24 10:40 AM  Result Value Ref Range   Hgb A1c MFr Bld 13.2 (H) 4.6 - 6.5 %    Assessment & Plan:   Problem List Items  Addressed This Visit     Encounter for general adult medical examination with abnormal findings - Primary (Chronic)   Preventative protocols reviewed and updated unless pt declined. Discussed healthy diet and lifestyle.       Advanced directives, counseling/discussion (Chronic)   Advanced directive discussion - has living will. Asked to bring us  a copy. Daughter Heather Shaffer is HCPOA. Full code.       Type 2 diabetes mellitus with neurologic complication, without long-term current use of insulin  (HCC)   Chronic, again deteriorated. Discussed importance of glycemic control and risk of micro and macrovascular diabetic complications.  Discussed doxycycline  alone did not cause deterioration of glycemic control.  She recently increased metformin  to 500mg  twice daily.  Recommend insulin  as best option for glycemic control - she does not want any form of injections given concerns over self-administering. Reviewed concerns over pancreatic/insulin  insufficiency as cause of worsening glycemic control.  Reviewed additional antihyperglycemic medication options - will start actos  15mg  daily. Continue metformin  500mg  bid.  She is overall hesitant for medication management.  She agrees to diabetes education referral and endocrinology referral.       Relevant Medications   metFORMIN  (GLUCOPHAGE -XR) 500 MG 24 hr tablet   atorvastatin  (LIPITOR) 20 MG tablet   pioglitazone  (ACTOS ) 15 MG tablet   Other Relevant Orders   Ambulatory referral to diabetic education   Ambulatory referral to Endocrinology   Hyperlipidemia associated with type 2 diabetes mellitus (HCC)   She has not been taking atorvastatin . Reviewed indication for statin in diabetic to prevent cardivascular complications. Encouraged she restart this - refilled. The 10-year ASCVD risk score (Arnett DK, et al., 2019) is: 34%   Values used to calculate the score:     Age: 89 years     Clincally relevant sex: Female     Is Non-Hispanic  African American: Yes     Diabetic: Yes     Tobacco smoker: No     Systolic Blood Pressure: 130 mmHg     Is BP treated: No     HDL Cholesterol: 80.6 mg/dL     Total Cholesterol: 193 mg/dL       Relevant Medications   metFORMIN  (GLUCOPHAGE -XR) 500 MG 24 hr tablet   atorvastatin  (LIPITOR) 20 MG tablet   pioglitazone  (ACTOS ) 15 MG tablet   Other Relevant Orders   Ambulatory referral to diabetic education   Ambulatory referral to Endocrinology   CKD stage 3 due to type 2 diabetes mellitus (HCC)   GFR stable 50s.  Jardiance  was unaffordable.  Consider low dose ACEI/ARB for renoprotection - pt not currently interested       Relevant Medications   metFORMIN  (GLUCOPHAGE -XR) 500 MG 24 hr tablet   atorvastatin  (LIPITOR) 20 MG tablet   pioglitazone  (ACTOS ) 15 MG tablet   Other Relevant Orders   Ambulatory referral to diabetic education   Ambulatory referral to Endocrinology   Elevated vitamin B12 level   Stop B12 - update levels next labs.       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    Dispense:  180 tablet    Refill:  3    Note change in dose   atorvastatin  (LIPITOR) 20 MG tablet    Sig: Take 1 tablet (20 mg total) by mouth daily.    Dispense:  90 tablet    Refill:  3   pioglitazone  (ACTOS ) 15 MG tablet    Sig: Take 1 tablet (15 mg total) by mouth  daily.    Dispense:  90 tablet    Refill:  3    Orders Placed This Encounter  Procedures   Ambulatory referral to diabetic education    Referral Priority:   Routine    Referral Type:   Consultation    Referral Reason:   Specialty Services Required    Number of Visits Requested:   1   Ambulatory referral to Endocrinology    Referral Priority:   Routine    Referral Type:   Consultation    Referral Reason:   Specialty Services Required    Number of Visits Requested:   1    Patient Instructions  Consider Flu shot and Pneumonia shot (Prevnar-20).  Stop b12 as levels were too high  Restart atorvastatin  for cholesterol control.  Continue metformin  twice daily. Restart actos  (pioglitazone ) 15mg  daily for sugar control.  I will refer you to diabetes doctor endocrinologist.  I will place new referral for diabetes classes.  Check with your gynecologist about repeat mammogram.  Bring us  copy of your living will.  Return in 3 months for follow up visit   Follow up plan: Return in about 3 months (around 11/11/2024), or if symptoms worsen or fail to improve, for follow up visit.  Anton Blas, MD

## 2024-08-11 NOTE — Assessment & Plan Note (Addendum)
 Advanced directive discussion - has living will. Asked to bring us  a copy. Daughter Heather Shaffer is HCPOA. Full code.

## 2024-08-11 NOTE — Assessment & Plan Note (Signed)
 Preventative protocols reviewed and updated unless pt declined. Discussed healthy diet and lifestyle.

## 2024-08-14 ENCOUNTER — Encounter: Payer: Self-pay | Admitting: Family Medicine

## 2024-08-14 DIAGNOSIS — R7989 Other specified abnormal findings of blood chemistry: Secondary | ICD-10-CM | POA: Insufficient documentation

## 2024-08-14 NOTE — Assessment & Plan Note (Signed)
 Stop B12 - update levels next labs.

## 2024-08-14 NOTE — Assessment & Plan Note (Signed)
 She has not been taking atorvastatin . Reviewed indication for statin in diabetic to prevent cardivascular complications. Encouraged she restart this - refilled. The 10-year ASCVD risk score (Arnett DK, et al., 2019) is: 34%   Values used to calculate the score:     Age: 73 years     Clincally relevant sex: Female     Is Non-Hispanic African American: Yes     Diabetic: Yes     Tobacco smoker: No     Systolic Blood Pressure: 130 mmHg     Is BP treated: No     HDL Cholesterol: 80.6 mg/dL     Total Cholesterol: 193 mg/dL

## 2024-08-14 NOTE — Assessment & Plan Note (Signed)
 Chronic, again deteriorated. Discussed importance of glycemic control and risk of micro and macrovascular diabetic complications.  Discussed doxycycline  alone did not cause deterioration of glycemic control.  She recently increased metformin  to 500mg  twice daily.  Recommend insulin  as best option for glycemic control - she does not want any form of injections given concerns over self-administering. Reviewed concerns over pancreatic/insulin  insufficiency as cause of worsening glycemic control.  Reviewed additional antihyperglycemic medication options - will start actos  15mg  daily. Continue metformin  500mg  bid.  She is overall hesitant for medication management.  She agrees to diabetes education referral and endocrinology referral.

## 2024-08-14 NOTE — Assessment & Plan Note (Addendum)
 GFR stable 50s.  Jardiance  was unaffordable.  Consider low dose ACEI/ARB for renoprotection - pt not currently interested

## 2024-08-23 NOTE — Progress Notes (Signed)
 Heather Shaffer                                          MRN: [id number]   08/23/2024   The [facility] Quality Team Specialist reviewed this patient medical record for the purposes of chart review for care gap closure. The following were reviewed: abstraction for care gap closure-kidney health evaluation for diabetes:eGFR  and uACR.    [facility] Quality Team

## 2024-08-23 NOTE — Progress Notes (Signed)
 Heather Shaffer                                          MRN: [id number]   08/23/2024   The [facility] Quality Team Specialist reviewed this patient medical record for the purposes of chart review for care gap closure. The following were reviewed: chart review for care gap closure-glycemic status assessment. A1c 13.2.    [facility] Quality Team

## 2024-09-08 ENCOUNTER — Telehealth: Payer: Self-pay

## 2024-09-08 NOTE — Telephone Encounter (Signed)
 Patient was identified as falling into the [place] Measure - Diabetes.   Patient was: Appointment already scheduled for:  patient had an appointment 08/11/24 not due for follow up until January 2026.

## 2024-10-11 LAB — MICROALBUMIN / CREATININE URINE RATIO: Microalb Creat Ratio: 30

## 2024-10-20 ENCOUNTER — Encounter: Admitting: Skilled Nursing Facility1

## 2025-08-16 ENCOUNTER — Ambulatory Visit
